# Patient Record
Sex: Male | Born: 1965 | Race: Black or African American | Hispanic: No | Marital: Single | Smoking: Never smoker
Health system: Southern US, Community
[De-identification: ages and names within clinical notes are randomized; demographics above are authoritative.]

## PROBLEM LIST (undated history)

## (undated) DIAGNOSIS — F1010 Alcohol abuse, uncomplicated: Secondary | ICD-10-CM

## (undated) DIAGNOSIS — R040 Epistaxis: Secondary | ICD-10-CM

---

## 2004-02-09 ENCOUNTER — Emergency Department: Payer: Self-pay | Admitting: Emergency Medicine

## 2004-02-10 ENCOUNTER — Emergency Department: Payer: Self-pay | Admitting: Emergency Medicine

## 2015-11-14 ENCOUNTER — Encounter: Payer: Self-pay | Admitting: Emergency Medicine

## 2015-11-14 ENCOUNTER — Emergency Department
Admission: EM | Admit: 2015-11-14 | Discharge: 2015-11-14 | Disposition: A | Discharge status: Home / Self Care | Payer: Self-pay | Attending: Emergency Medicine | Admitting: Emergency Medicine

## 2015-11-14 DIAGNOSIS — M799 Soft tissue disorder, unspecified: Secondary | ICD-10-CM | POA: Insufficient documentation

## 2015-11-14 DIAGNOSIS — M7989 Other specified soft tissue disorders: Secondary | ICD-10-CM

## 2015-11-14 DIAGNOSIS — K4090 Unilateral inguinal hernia, without obstruction or gangrene, not specified as recurrent: Secondary | ICD-10-CM | POA: Insufficient documentation

## 2015-11-14 DIAGNOSIS — K4091 Unilateral inguinal hernia, without obstruction or gangrene, recurrent: Secondary | ICD-10-CM

## 2015-11-14 HISTORY — DX: Epistaxis: R04.0

## 2015-11-14 LAB — COMPREHENSIVE METABOLIC PANEL
ALT: 51 U/L (ref 17–63)
ANION GAP: 6 (ref 5–15)
AST: 115 U/L — ABNORMAL HIGH (ref 15–41)
Albumin: 3.9 g/dL (ref 3.5–5.0)
Alkaline Phosphatase: 55 U/L (ref 38–126)
BUN: 9 mg/dL (ref 6–20)
CHLORIDE: 106 mmol/L (ref 101–111)
CO2: 27 mmol/L (ref 22–32)
CREATININE: 0.85 mg/dL (ref 0.61–1.24)
Calcium: 8.8 mg/dL — ABNORMAL LOW (ref 8.9–10.3)
GFR calc non Af Amer: >60 mL/min (ref >60)
Glucose, Bld: 98 mg/dL (ref 65–99)
POTASSIUM: 3.9 mmol/L (ref 3.5–5.1)
SODIUM: 139 mmol/L (ref 135–145)
Total Bilirubin: 0.5 mg/dL (ref 0.3–1.2)
Total Protein: 7.7 g/dL (ref 6.5–8.1)

## 2015-11-14 LAB — CBC
HEMATOCRIT: 39 % — AB (ref 40.0–52.0)
HEMOGLOBIN: 13.2 g/dL (ref 13.0–18.0)
MCH: 30 pg (ref 26.0–34.0)
MCHC: 33.9 g/dL (ref 32.0–36.0)
MCV: 88.4 fL (ref 80.0–100.0)
Platelets: 167 10*3/uL (ref 150–440)
RBC: 4.41 MIL/uL (ref 4.40–5.90)
RDW: 14.4 % (ref 11.5–14.5)
WBC: 3.7 10*3/uL — ABNORMAL LOW (ref 3.8–10.6)

## 2015-11-14 LAB — URINALYSIS COMPLETE WITH MICROSCOPIC (ARMC ONLY)
Bilirubin Urine: NEGATIVE
Glucose, UA: NEGATIVE mg/dL
HGB URINE DIPSTICK: NEGATIVE
Ketones, ur: NEGATIVE mg/dL
Leukocytes, UA: NEGATIVE
Nitrite: NEGATIVE
PH: 5 (ref 5.0–8.0)
PROTEIN: NEGATIVE mg/dL
Specific Gravity, Urine: 1.017 (ref 1.005–1.030)

## 2015-11-14 LAB — LIPASE, BLOOD: LIPASE: 25 U/L (ref 11–51)

## 2015-11-14 MED ORDER — TRAMADOL HCL 50 MG PO TABS: 50.0000 mg | ORAL_TABLET | Freq: Four times a day (QID) | ORAL | 0 refills | Status: AC | PRN

## 2015-11-14 NOTE — ED Notes (Signed)
Discharge instructions reviewed with patient. Questions fielded by this RN. Patient verbalizes understanding of instructions. Patient discharged home in stable condition per Williams MD . No acute distress noted at time of discharge.   

## 2015-11-14 NOTE — ED Triage Notes (Signed)
Inguinal hernia x 2 year and cyst to left buttock x 2 years.  Patient's niece stated that today patient was helping a family member move and started to c/o pain to inguinal hernia.

## 2015-11-14 NOTE — ED Provider Notes (Signed)
[facility] Emergency Department Provider Note        Time seen: ----------------------------------------- 9:12 PM on 11/14/2015 -----------------------------------------    I have reviewed the triage vital signs and the nursing notes.   HISTORY  Chief Complaint Abdominal Pain    HPI Nicholas Ali is a 50 y.o. male who presents to ER for right inguinal pain for the last 2 years and a cyst on his right buttock for the last 2 years. Patient's niece stated today he was helping a family member move and started complaining of pain related to the inguinal hernia. Patient has been evaluated in the past for this but did not have insurance so it could not be taken care of. Patient states the hernia does sometimes completely resolve. He denies fevers, vomiting or other complaints.   Past Medical History:  Diagnosis Date  . Epistaxis     There are no active problems to display for this patient.   History reviewed. No pertinent surgical history.  Allergies Review of patient's allergies indicates no known allergies.  Social History Social History  Substance Use Topics  . Smoking status: Never Smoker  . Smokeless tobacco: Former Neurosurgeon  . Alcohol use Yes    Review of Systems Constitutional: Negative for fever. Cardiovascular: Negative for chest pain. Respiratory: Negative for shortness of breath. Gastrointestinal: Positive for right inguinal pain Genitourinary: Negative for dysuria. Musculoskeletal: Negative for back pain. Skin: Positive for cystic lesion on the right buttock Neurological: Negative for headaches, focal weakness or numbness.  10-point ROS otherwise negative.  ____________________________________________   PHYSICAL EXAM:  VITAL SIGNS: ED Triage Vitals  Enc Vitals Group     BP 11/14/15 2058 125/84     Pulse Rate 11/14/15 2058 83     Resp 11/14/15 2058 18     Temp 11/14/15 2058 98.1 F (36.7 C)     Temp Source 11/14/15 2058  Oral     SpO2 11/14/15 2058 97 %     Weight 11/14/15 2058 189 lb (85.7 kg)     Height 11/14/15 2058 6' (1.829 m)     Head Circumference --      Peak Flow --      Pain Score 11/14/15 2059 6     Pain Loc --      Pain Edu? --      Excl. in GC? --     Constitutional: Alert and oriented. Well appearing and in no distress. Eyes: Conjunctivae are normal. PERRL. Normal extraocular movements. ENT   Head: Normocephalic and atraumatic.   Nose: No congestion/rhinnorhea.   Mouth/Throat: Mucous membranes are moist.   Neck: No stridor. Cardiovascular: Normal rate, regular rhythm. No murmurs, rubs, or gallops. Respiratory: Normal respiratory effort without tachypnea nor retractions. Breath sounds are clear and equal bilaterally. No wheezes/rales/rhonchi. Gastrointestinal: Large, nontender, not reducible right inguinal hernia. Musculoskeletal: Nontender with normal range of motion in all extremities. No lower extremity tenderness nor edema. Neurologic:  Normal speech and language. No gross focal neurologic deficits are appreciated.  Skin:  Large, nontender and irregular cystic structure over the outer aspect of the right buttock Psychiatric: Mood and affect are normal. Speech and behavior are normal.  ____________________________________________  ED COURSE:  Pertinent labs & imaging results that were available during my care of the patient were reviewed by me and considered in my medical decision making (see chart for details). Clinical Course  Patient is in no acute distress, these are chronic and do not require immediate attention. He does  not have evidence of strangulated bowel. He'll be referred to general surgery for outpatient follow-up.  Procedures ____________________________________________   LABS (pertinent positives/negatives)  Labs Reviewed  CBC - Abnormal; Notable for the following:       Result Value   WBC 3.7 (*)    HCT 39.0 (*)    All other components within  normal limits  LIPASE, BLOOD  COMPREHENSIVE METABOLIC PANEL  URINALYSIS COMPLETEWITH MICROSCOPIC ([facility] ONLY)  ____________________________________________  FINAL ASSESSMENT AND PLAN  Inguinal hernia, cyst  Plan: Patient with labs as dictated above. Again these issues are chronic and likely exacerbated by lifting the day but examination is benign. He is referred to general surgery as an outpatient.   Emily Filbert, MD   Note: This dictation was prepared with Dragon dictation. Any transcriptional errors that result from this process are unintentional    Emily Filbert, MD 11/14/15 2131

## 2015-11-20 ENCOUNTER — Ambulatory Visit: Payer: Self-pay | Admitting: Surgery

## 2015-11-30 ENCOUNTER — Ambulatory Visit: Payer: Self-pay | Admitting: Surgery

## 2017-07-03 ENCOUNTER — Emergency Department
Admission: EM | Admit: 2017-07-03 | Discharge: 2017-07-03 | Disposition: A | Discharge status: Home / Self Care | Payer: Self-pay | Attending: Emergency Medicine | Admitting: Emergency Medicine

## 2017-07-03 ENCOUNTER — Other Ambulatory Visit: Payer: Self-pay

## 2017-07-03 ENCOUNTER — Emergency Department: Payer: Self-pay

## 2017-07-03 DIAGNOSIS — F1010 Alcohol abuse, uncomplicated: Secondary | ICD-10-CM

## 2017-07-03 DIAGNOSIS — F22 Delusional disorders: Secondary | ICD-10-CM | POA: Insufficient documentation

## 2017-07-03 DIAGNOSIS — Z87828 Personal history of other (healed) physical injury and trauma: Secondary | ICD-10-CM

## 2017-07-03 DIAGNOSIS — R440 Auditory hallucinations: Secondary | ICD-10-CM

## 2017-07-03 DIAGNOSIS — R410 Disorientation, unspecified: Secondary | ICD-10-CM

## 2017-07-03 LAB — COMPREHENSIVE METABOLIC PANEL
ALK PHOS: 69 U/L (ref 38–126)
ALT: 17 U/L (ref 17–63)
AST: 29 U/L (ref 15–41)
Albumin: 4.3 g/dL (ref 3.5–5.0)
Anion gap: 11 (ref 5–15)
BUN: 10 mg/dL (ref 6–20)
CALCIUM: 9.1 mg/dL (ref 8.9–10.3)
CO2: 25 mmol/L (ref 22–32)
CREATININE: 0.93 mg/dL (ref 0.61–1.24)
Chloride: 98 mmol/L — ABNORMAL LOW (ref 101–111)
Glucose, Bld: 109 mg/dL — ABNORMAL HIGH (ref 65–99)
Potassium: 3.7 mmol/L (ref 3.5–5.1)
SODIUM: 134 mmol/L — AB (ref 135–145)
Total Bilirubin: 0.6 mg/dL (ref 0.3–1.2)
Total Protein: 8.6 g/dL — ABNORMAL HIGH (ref 6.5–8.1)

## 2017-07-03 LAB — URINALYSIS, COMPLETE (UACMP) WITH MICROSCOPIC
Bacteria, UA: NONE SEEN
Bilirubin Urine: NEGATIVE
GLUCOSE, UA: NEGATIVE mg/dL
HGB URINE DIPSTICK: NEGATIVE
Ketones, ur: NEGATIVE mg/dL
Leukocytes, UA: NEGATIVE
NITRITE: NEGATIVE
PH: 6 (ref 5.0–8.0)
Protein, ur: NEGATIVE mg/dL
Specific Gravity, Urine: 1.014 (ref 1.005–1.030)
Squamous Epithelial / LPF: NONE SEEN

## 2017-07-03 LAB — FOLATE: Folate: 17.4 ng/mL (ref >5.9)

## 2017-07-03 LAB — CBC
HCT: 45.8 % (ref 40.0–52.0)
HEMOGLOBIN: 15 g/dL (ref 13.0–18.0)
MCH: 26.6 pg (ref 26.0–34.0)
MCHC: 32.7 g/dL (ref 32.0–36.0)
MCV: 81.1 fL (ref 80.0–100.0)
PLATELETS: 173 10*3/uL (ref 150–440)
RBC: 5.64 MIL/uL (ref 4.40–5.90)
RDW: 18.7 % — ABNORMAL HIGH (ref 11.5–14.5)
WBC: 5.3 10*3/uL (ref 3.8–10.6)

## 2017-07-03 LAB — URINE DRUG SCREEN, QUALITATIVE (ARMC ONLY)
AMPHETAMINES, UR SCREEN: NOT DETECTED
Barbiturates, Ur Screen: NOT DETECTED
Benzodiazepine, Ur Scrn: NOT DETECTED
CANNABINOID 50 NG, UR ~~LOC~~: NOT DETECTED
COCAINE METABOLITE, UR ~~LOC~~: NOT DETECTED
MDMA (ECSTASY) UR SCREEN: NOT DETECTED
Methadone Scn, Ur: NOT DETECTED
OPIATE, UR SCREEN: NOT DETECTED
PHENCYCLIDINE (PCP) UR S: NOT DETECTED
Tricyclic, Ur Screen: NOT DETECTED

## 2017-07-03 LAB — TSH: TSH: 5.59 u[IU]/mL — AB (ref 0.350–4.500)

## 2017-07-03 LAB — T4, FREE: Free T4: 0.99 ng/dL (ref 0.61–1.12)

## 2017-07-03 LAB — RAPID HIV SCREEN (HIV 1/2 AB+AG)
HIV 1/2 ANTIBODIES: NONREACTIVE
HIV-1 P24 ANTIGEN - HIV24: NONREACTIVE

## 2017-07-03 LAB — VITAMIN B12: VITAMIN B 12: 255 pg/mL (ref 180–914)

## 2017-07-03 LAB — ETHANOL

## 2017-07-03 IMAGING — CT CT HEAD W/O CM
3 series · 16 of 47 positions shown, 19 images · non-contrast
Comparison: None available

CLINICAL DATA: Hallucination, altered mental status

EXAM:
CT HEAD WITHOUT CONTRAST
TECHNIQUE: Contiguous axial images were obtained from the base of the skull
through the vertex without intravenous contrast.

[Series 2: head wo · axial · 0.40mm/px · z∈[-114,+16]mm · 10 of 32 slices shown, 13 images]
[im 3/32  brain]
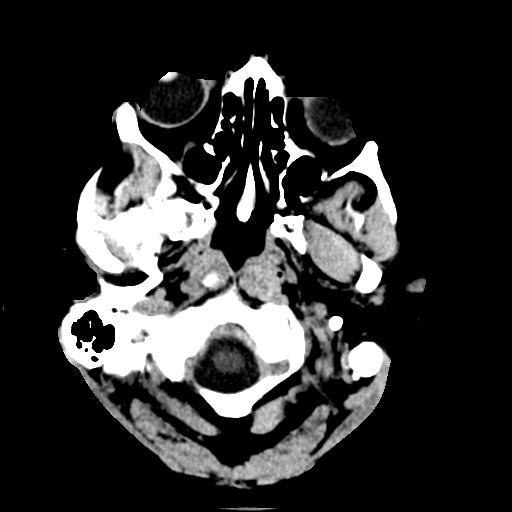
[im 3/32  bone]
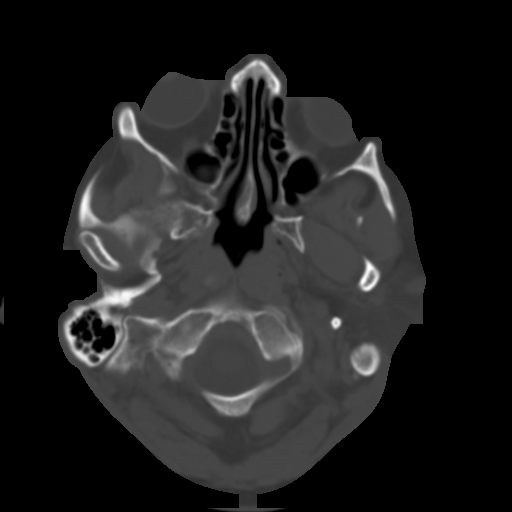
[im 6/32  brain]
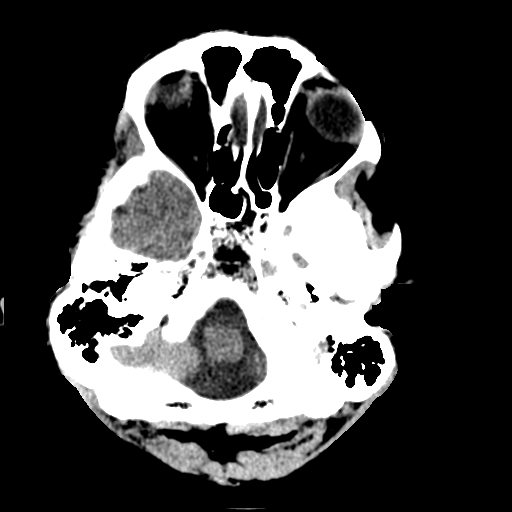
[im 9/32  brain]
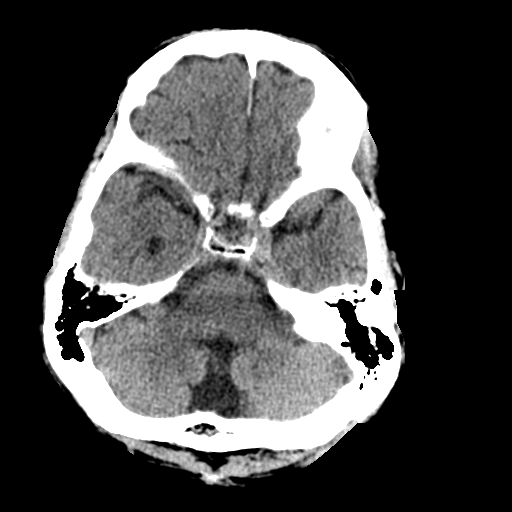
[im 11/32  brain]
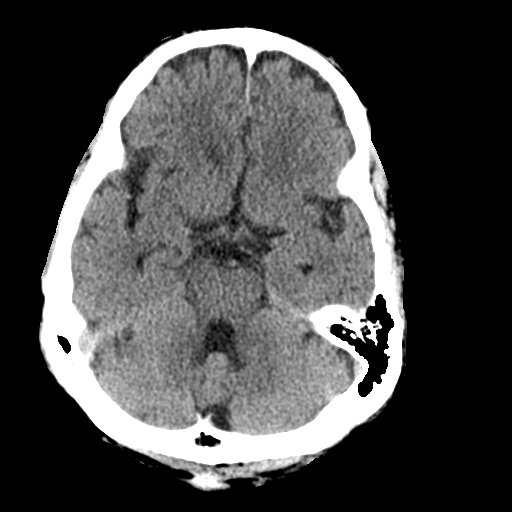
[im 14/32  brain]
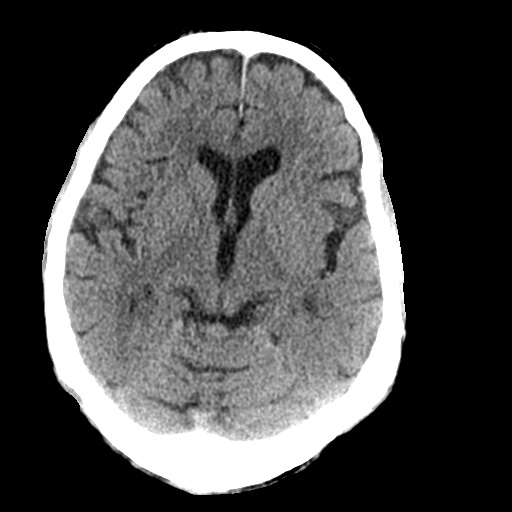
[im 14/32  bone]
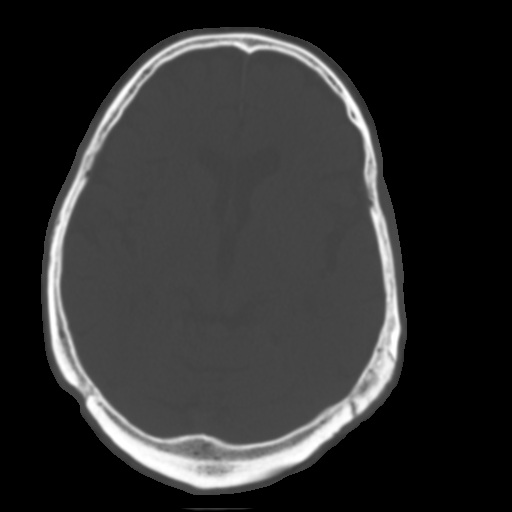
[im 18/32  brain]
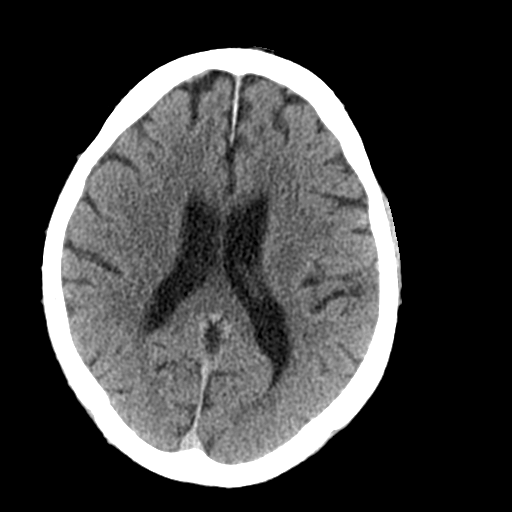
[im 21/32  brain]
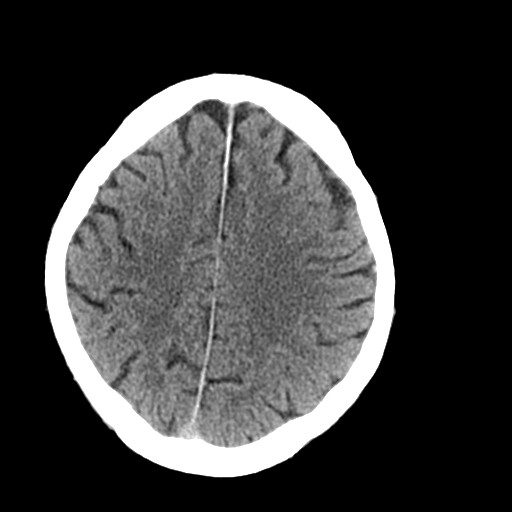
[im 24/32  brain]
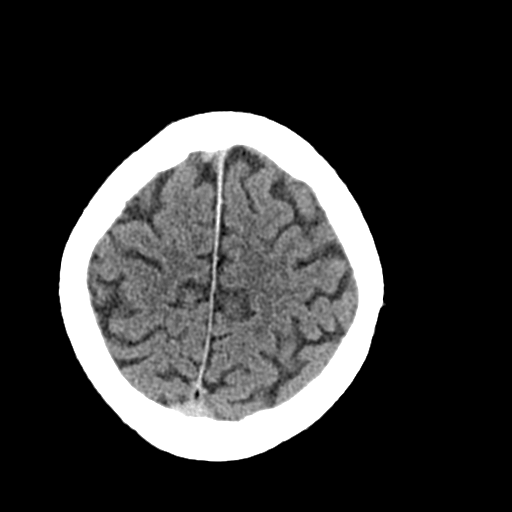
[im 26/32  brain]
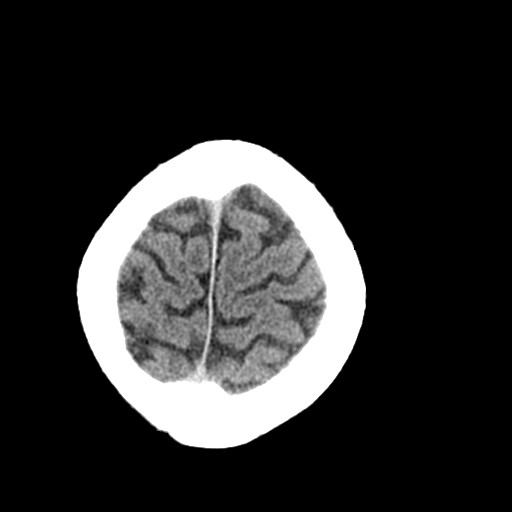
[im 26/32  bone]
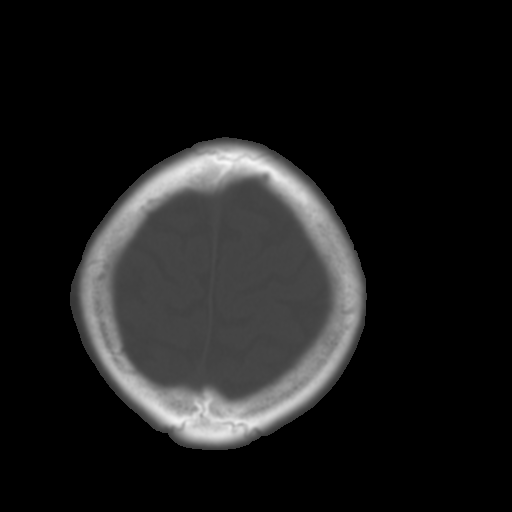
[im 29/32  brain]
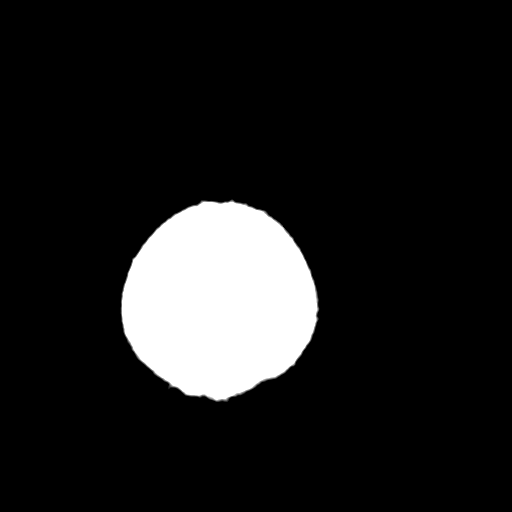

[Series 4: coronal soft tissue · coronal · 0.32mm/px · 3 of 65 slices shown]
[im 22/65  brain]
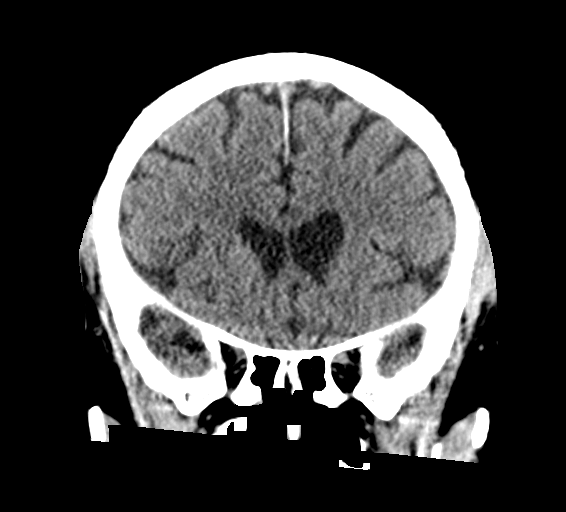
[im 29/65  brain]
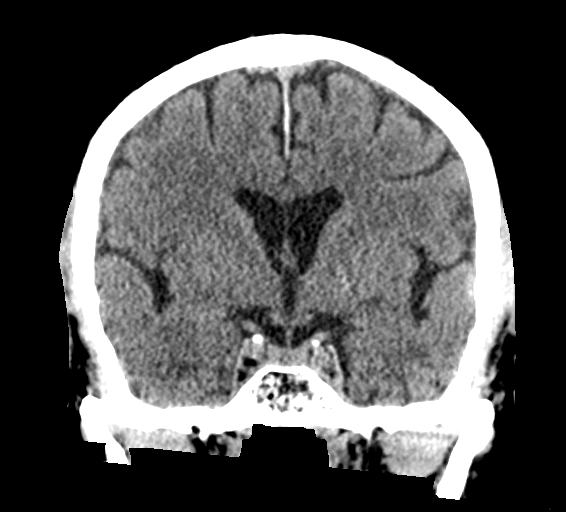
[im 36/65  brain]
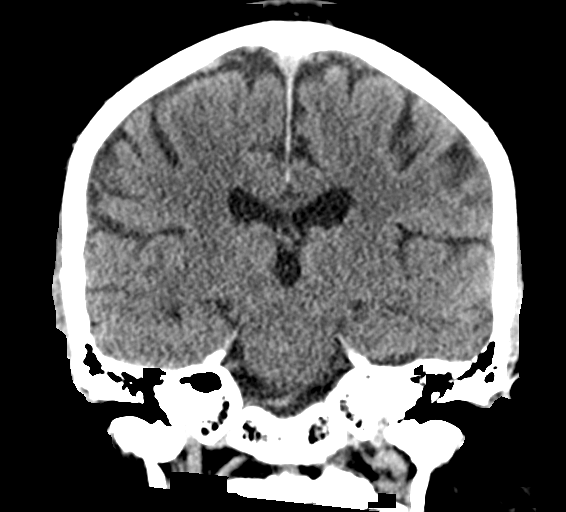

[Series 5: sagittal soft tissue · sagittal · 0.36mm/px · 3 of 52 slices shown]
[im 18/52  brain]
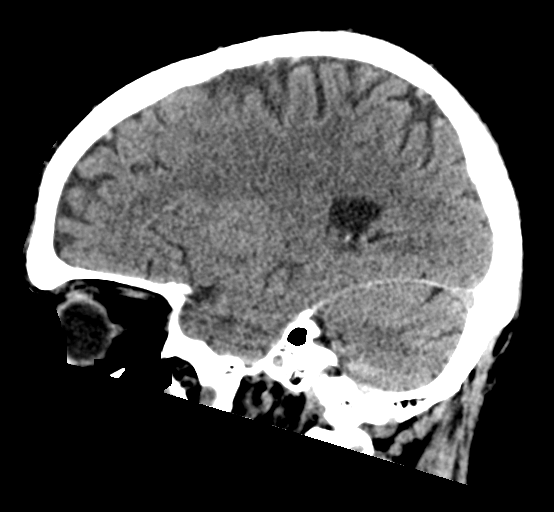
[im 26/52  brain]
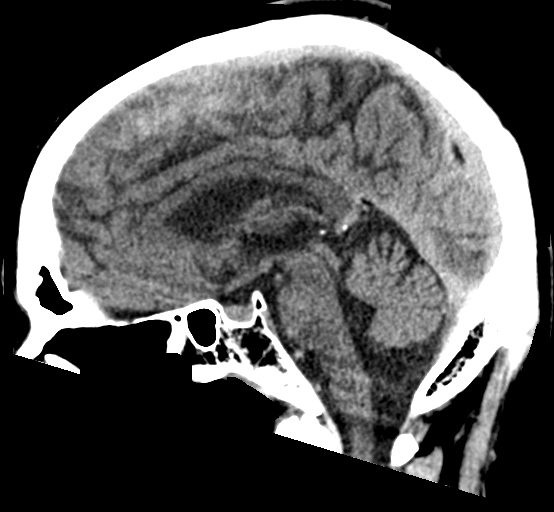
[im 35/52  brain]
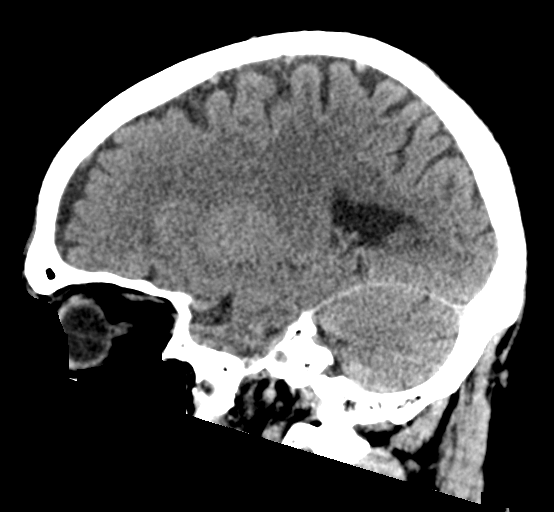

[16 of 47 positions shown; findings below may reference images not displayed]

FINDINGS: Brain: Mild atrophy pattern without acute intracranial hemorrhage,
mass lesion, midline shift, herniation or hydrocephalus. No
extra-axial fluid collection. No focal mass effect or edema.
Cisterns are patent. No cerebellar abnormality.

Vascular: No hyperdense vessel or unexpected calcification.

Skull: Normal. Negative for fracture or focal lesion.

Sinuses/Orbits: Orbits unremarkable. Right maxillary retention cyst
or polyp partially imaged measures 15 mm.

Other: None.
IMPRESSION: Mild brain atrophy without acute intracranial abnormality by
noncontrast CT.
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
## 2017-07-03 NOTE — ED Notes (Signed)
Butch RN to dress out patient.
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
## 2017-07-03 NOTE — ED Notes (Signed)
SOC complete.  

## 2017-07-03 NOTE — ED Notes (Signed)
NAD noted at time of D/C. Pt denies questions or concerns. Pt taken to the lobby via wheelchair at this time. Pt D/C discussed with Gearldine BienenstockBrandy, Consulting civil engineerCharge RN, due to patient being alert and oriented, and considered safe for D/C pt to be placed in front of [employer] officer and courtesy phone to continue to try to find a ride.

## 2017-07-03 NOTE — ED Notes (Signed)
Pt given meal tray at this time. NAD noted. Will continue to monitor for further patient needs. 

## 2017-07-03 NOTE — ED Notes (Signed)
IVC PAPERS INITIATED BY MD FORBACH/RN MEGAN AND ODS SECURITY MADE AWARE.
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
## 2017-07-03 NOTE — ED Notes (Signed)
Patient roomed in room 21. Patient denies SI/HI. Patient states he hears voices and sometimes see hallucinations. Patient sitting in room calm and cooperative.

## 2017-07-03 NOTE — ED Notes (Signed)
Pt given breakfast tray at this time. Pt sitting up on side of bed eating. Will continue to monitor for further patient needs.

## 2017-07-03 NOTE — ED Notes (Signed)
[facility] camera set up for assessment.
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
## 2017-07-05 LAB — RPR: RPR Ser Ql: NONREACTIVE

## 2018-03-02 ENCOUNTER — Encounter: Payer: Self-pay | Admitting: Intensive Care

## 2018-03-02 ENCOUNTER — Other Ambulatory Visit: Payer: Self-pay

## 2018-03-02 ENCOUNTER — Emergency Department
Admission: EM | Admit: 2018-03-02 | Discharge: 2018-03-02 | Disposition: A | Discharge status: Home / Self Care | Payer: Medicaid Other | Attending: Emergency Medicine | Admitting: Emergency Medicine

## 2018-03-02 DIAGNOSIS — Z0000 Encounter for general adult medical examination without abnormal findings: Secondary | ICD-10-CM | POA: Insufficient documentation

## 2018-03-02 DIAGNOSIS — F1011 Alcohol abuse, in remission: Secondary | ICD-10-CM

## 2018-03-02 HISTORY — DX: Alcohol abuse, uncomplicated: F10.10

## 2018-03-02 NOTE — ED Notes (Addendum)
FIRST NURSE NOTE:  Pt here with niece with reports of back pain.  Niece asking for social worker as well.  Pt alert and oriented, no distress noted on arrival.

## 2018-03-02 NOTE — ED Notes (Signed)
Pt verbalizes  "I have been clean and sober for about seven days - if I go to the other place I think I will be better and get to live clean."   Pt with niece at bedside
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
## 2018-03-02 NOTE — Discharge Instructions (Addendum)
Your exam and evaluation are overall reassuring in the emergency department today.  I see no active medical or musculoskeletal problems that should stop you from being able to enter your long-term alcohol treatment program/facility.  Return to the emergency department immediately for any worsening condition including confusion altered mental status, back pain, alcohol abuse or any depression or thoughts of wanting hurt yourself or others.

## 2018-03-02 NOTE — ED Notes (Signed)

## 2018-03-02 NOTE — ED Triage Notes (Signed)
Patients niece states he has [facility] and is here to get Medical clearance to be accepted into another facility for HX alcohol abuse. Denies pain.

## 2018-12-26 ENCOUNTER — Encounter: Payer: Self-pay | Admitting: Emergency Medicine

## 2018-12-26 ENCOUNTER — Emergency Department
Admission: EM | Admit: 2018-12-26 | Discharge: 2018-12-26 | Disposition: A | Discharge status: Home / Self Care | Payer: Medicaid Other | Attending: Student | Admitting: Student

## 2018-12-26 ENCOUNTER — Emergency Department: Payer: Medicaid Other

## 2018-12-26 DIAGNOSIS — R600 Localized edema: Secondary | ICD-10-CM

## 2018-12-26 LAB — CBC WITH DIFFERENTIAL/PLATELET
Abs Immature Granulocytes: 0.03 10*3/uL (ref 0.00–0.07)
Basophils Absolute: 0 10*3/uL (ref 0.0–0.1)
Basophils Relative: 1 %
Eosinophils Absolute: 0 10*3/uL (ref 0.0–0.5)
Eosinophils Relative: 1 %
HCT: 39.7 % (ref 39.0–52.0)
Hemoglobin: 12.9 g/dL — ABNORMAL LOW (ref 13.0–17.0)
Immature Granulocytes: 1 %
Lymphocytes Relative: 27 %
Lymphs Abs: 1.7 10*3/uL (ref 0.7–4.0)
MCH: 27.1 pg (ref 26.0–34.0)
MCHC: 32.5 g/dL (ref 30.0–36.0)
MCV: 83.4 fL (ref 80.0–100.0)
Monocytes Absolute: 0.5 10*3/uL (ref 0.1–1.0)
Monocytes Relative: 8 %
Neutro Abs: 4 10*3/uL (ref 1.7–7.7)
Neutrophils Relative %: 62 %
Platelets: 162 10*3/uL (ref 150–400)
RBC: 4.76 MIL/uL (ref 4.22–5.81)
RDW: 16.8 % — ABNORMAL HIGH (ref 11.5–15.5)
WBC: 6.3 10*3/uL (ref 4.0–10.5)
nRBC: 0 % (ref 0.0–0.2)

## 2018-12-26 LAB — BASIC METABOLIC PANEL
Anion gap: 11 (ref 5–15)
BUN: 9 mg/dL (ref 6–20)
CO2: 26 mmol/L (ref 22–32)
Calcium: 8.5 mg/dL — ABNORMAL LOW (ref 8.9–10.3)
Chloride: 103 mmol/L (ref 98–111)
Creatinine, Ser: 0.87 mg/dL (ref 0.61–1.24)
GFR calc Af Amer: >60 mL/min (ref >60)
GFR calc non Af Amer: >60 mL/min (ref >60)
Glucose, Bld: 106 mg/dL — ABNORMAL HIGH (ref 70–99)
Potassium: 3.2 mmol/L — ABNORMAL LOW (ref 3.5–5.1)
Sodium: 140 mmol/L (ref 135–145)

## 2018-12-26 LAB — BRAIN NATRIURETIC PEPTIDE: B Natriuretic Peptide: 110 pg/mL — ABNORMAL HIGH (ref 0.0–100.0)

## 2018-12-26 IMAGING — DX DG CHEST 1V
1 series · 1 of 1 positions shown · non-contrast
Comparison: None.

CLINICAL DATA: Leg swelling

EXAM:
CHEST  1 VIEW

[chest ap]
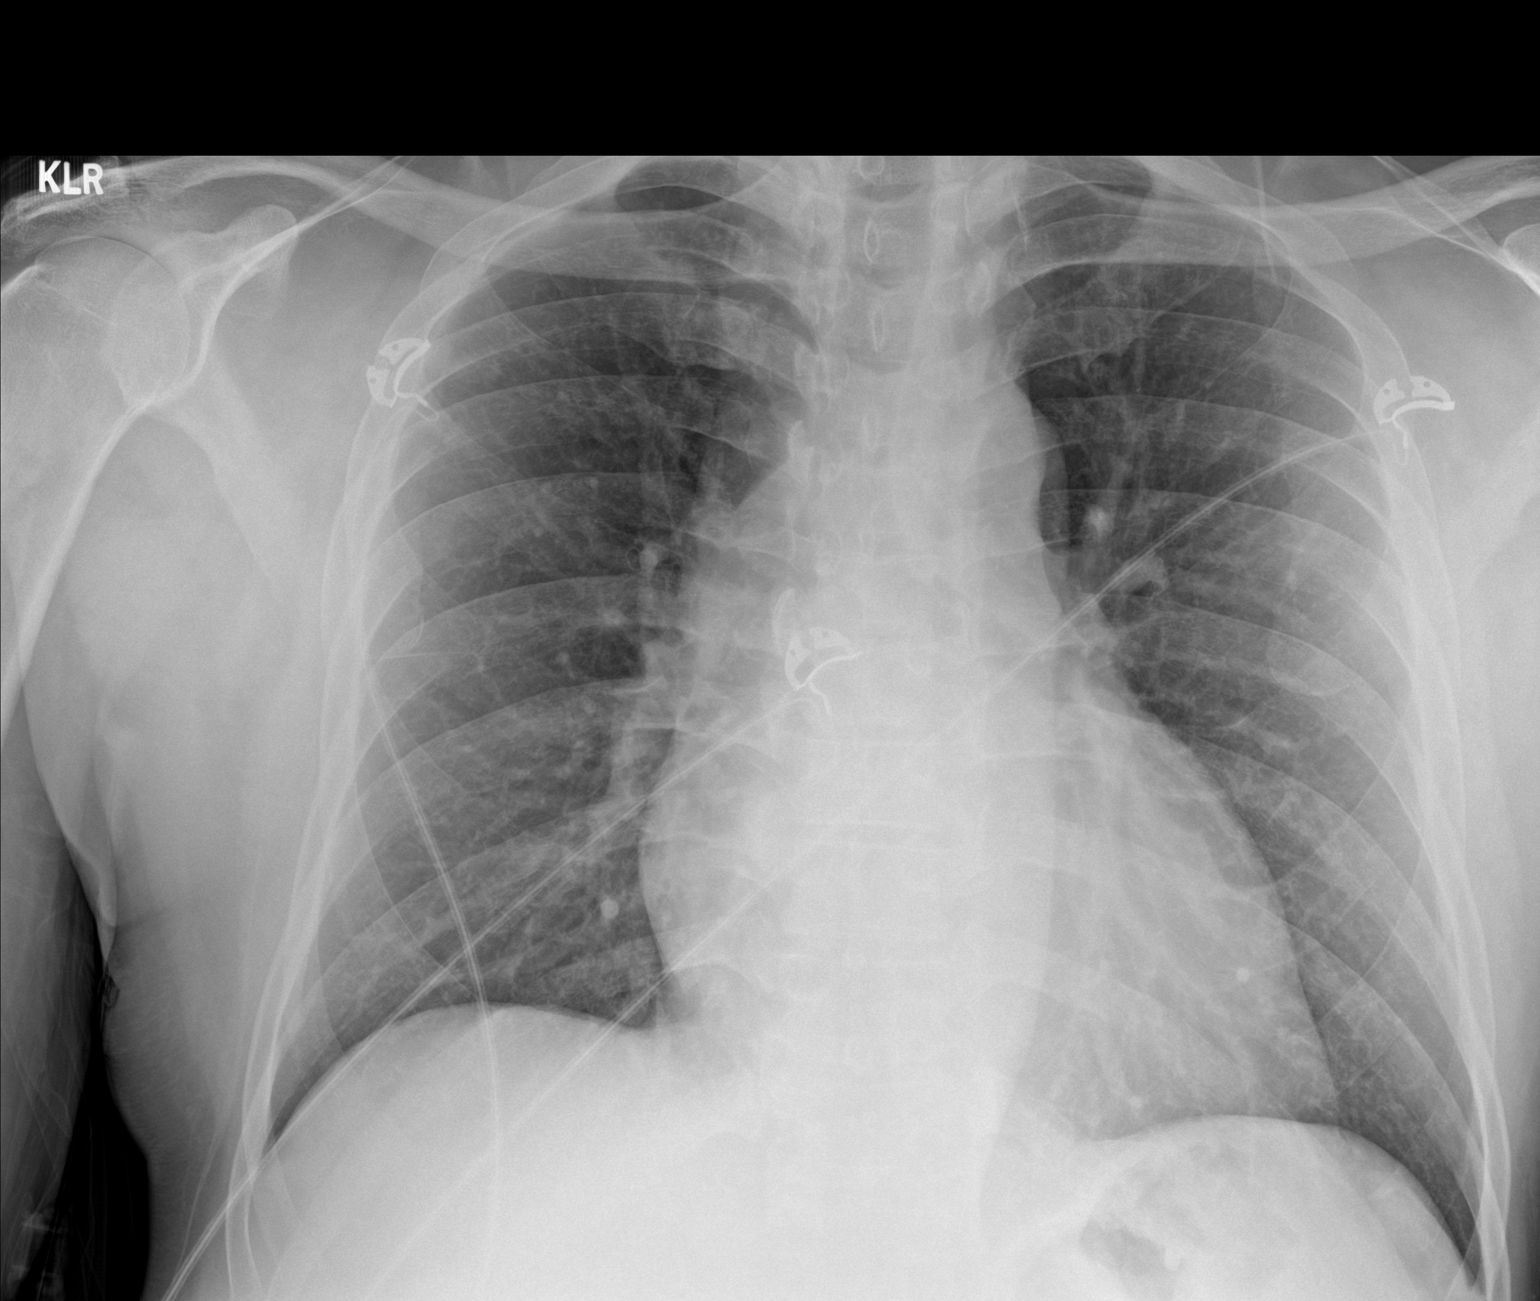

[1 of 1 positions shown; findings below may reference images not displayed]

FINDINGS: No focal opacity or pleural effusion. Borderline cardiomegaly
without edema. No pneumothorax
IMPRESSION: No active disease.  Borderline to mild cardiomegaly without edema

## 2018-12-26 MED ORDER — FUROSEMIDE 20 MG PO TABS: 20.0000 mg | ORAL_TABLET | Freq: Every day | ORAL | 0 refills | Status: DC

## 2018-12-26 MED ORDER — POTASSIUM CHLORIDE ER 10 MEQ PO TBCR: 20.0000 meq | EXTENDED_RELEASE_TABLET | Freq: Every day | ORAL | 0 refills | Status: DC

## 2018-12-26 MED ORDER — POTASSIUM CHLORIDE CRYS ER 20 MEQ PO TBCR
40.0000 meq | EXTENDED_RELEASE_TABLET | Freq: Once | ORAL | Status: AC
  Administered 2018-12-26: 40 meq via ORAL
  Filled 2018-12-26: qty 2

## 2018-12-26 NOTE — ED Provider Notes (Signed)
[facility] Emergency Department Provider Note  ____________________________________________   First MD Initiated Contact with Patient 12/26/18 1539     (approximate)  I have reviewed the triage vital signs and the nursing notes.  History  Chief Complaint Leg Swelling    HPI Nicholas Ali is a 53 y.o. male with hx as below who presents for bilateral leg swelling. Patient states he first noticed the swelling last night. He states normally he does not have an swelling. Swelling is symmetric and bilateral. He denies any weakness, numbness or tingling. He denies any trauma. He is able to walk w/o issue. He denies any fevers, chest pain, SOB, or difficulty breathing. No hx of heart failure. No hx of DVT or PE. No recent travel. No new medications. 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          Past Medical Hx Past Medical History:  Diagnosis Date  . Alcohol abuse   . Epistaxis     Problem List Patient Active Problem List   Diagnosis Date Noted  . Acute delirium 07/03/2017  . Alcohol abuse 07/03/2017  . History of head injury 07/03/2017    Past Surgical Hx History reviewed. No pertinent surgical history.  Medications Prior to Admission medications   Not on File    Allergies Patient has no known allergies.  Family Hx No family history on file.  Social Hx Social History   Tobacco Use  . Smoking status: Never Smoker  . Smokeless tobacco: Former Network engineer Use Topics  . Alcohol use: Not Currently  . Drug use: No     Review of Systems  Constitutional: Negative for fever. Negative for chills. Eyes: Negative for visual changes. ENT: Negative for sore throat. Cardiovascular: Negative for chest pain. Respiratory: Negative for shortness of breath. Gastrointestinal: Negative for abdominal pain. Negative for nausea. Negative for vomiting. Genitourinary: Negative for dysuria. Musculoskeletal:  + for leg swelling. Skin: Negative for rash. Neurological: Negative for for headaches.   Physical Exam  Vital Signs: ED Triage Vitals  Enc Vitals Group     BP 12/26/18 1522 (!) 151/92     Pulse Rate 12/26/18 1522 83     Resp 12/26/18 1547 19     Temp 12/26/18 1522 98.5 F (36.9 C)     Temp Source 12/26/18 1522 Oral     SpO2 12/26/18 1522 95 %     Weight 12/26/18 1532 200 lb (90.7 kg)     Height 12/26/18 1532 6\' 1"  (1.854 m)     Head Circumference --      Peak Flow --      Pain Score 12/26/18 1532 0     Pain Loc --      Pain Edu? --      Excl. in [place]? --     Constitutional: Alert and oriented.  Eyes: Conjunctivae clear. Sclera anicteric. Head: Normocephalic. Atraumatic. Nose: No congestion. No rhinorrhea. Mouth/Throat: Mucous membranes are moist.  Neck: No stridor.   Cardiovascular: Normal rate, regular rhythm. No murmurs. Extremities well perfused. Respiratory: Normal respiratory effort.  Lungs CTAB. Gastrointestinal: Soft and non-tender. No distention.  Musculoskeletal: Bilateral, symmetric pitting LE edema to mid shin. Neurologic:  Normal speech and language. No gross focal neurologic deficits are appreciated.  Skin: Skin is warm, dry and intact. No erythema, warmth, or induration.Marland Kitchen Psychiatric: Mood and affect are appropriate for situation.  EKG  N/A    Radiology  CXR: IMPRESSION:  No active disease. Borderline to mild cardiomegaly without edema     Procedures  Procedure(s) performed (including critical care):  Procedures   Initial Impression / Assessment and Plan / ED Course  53 y.o. male who presents to the ED who presents for bilateral lower extremity edema, as above  Ddx: heart failure (though no respiratory symptoms), dependent edema (on his feet all day). No evidence of infection or cellulitis. No asymmetry or risk factors for DVT.  Plan: will obtain basic labs, imaging.   Labs largely unremarkable, slightly low K. Will give PO repletion  here, and plan for low dose of Lasix to assist with fluid status. Will also give  PO K repletion to avoid hypokalemia with the Lasix. Advised PCP follow up and given return precautions. Patient agreeable w/ plan.     Final Clinical Impression(s) / ED Diagnosis  Final diagnoses:  Leg edema       Note:  This document was prepared using Dragon voice recognition software and may include unintentional dictation errors.   Miguel AschoffMonks, Devontae Casasola L., MD 12/26/18 2303

## 2018-12-26 NOTE — Discharge Instructions (Signed)
Thank you for letting us take care of you in the emergency department today.   New medications we have prescribed:  - lasix - this medication will help you pee off extra fluid - potassium - this will help keep your potassium levels normal  Please follow up with: - A primary care doctor to review your ER visit and follow up on your symptoms.   Please return to the ER for any new or worsening symptoms.

## 2018-12-26 NOTE — ED Triage Notes (Signed)
Pt to ED with c/o of bilateral leg swelling that started last night.

## 2020-10-14 ENCOUNTER — Other Ambulatory Visit: Payer: Self-pay

## 2020-10-14 DIAGNOSIS — Z87891 Personal history of nicotine dependence: Secondary | ICD-10-CM | POA: Insufficient documentation

## 2020-10-14 DIAGNOSIS — R519 Headache, unspecified: Secondary | ICD-10-CM | POA: Insufficient documentation

## 2020-10-14 DIAGNOSIS — R945 Abnormal results of liver function studies: Secondary | ICD-10-CM | POA: Insufficient documentation

## 2020-10-14 DIAGNOSIS — R110 Nausea: Secondary | ICD-10-CM | POA: Insufficient documentation

## 2020-10-14 LAB — CBC
HCT: 41.6 % (ref 39.0–52.0)
Hemoglobin: 14.2 g/dL (ref 13.0–17.0)
MCH: 30 pg (ref 26.0–34.0)
MCHC: 34.1 g/dL (ref 30.0–36.0)
MCV: 87.9 fL (ref 80.0–100.0)
Platelets: 150 10*3/uL (ref 150–400)
RBC: 4.73 MIL/uL (ref 4.22–5.81)
RDW: 15.7 % — ABNORMAL HIGH (ref 11.5–15.5)
WBC: 4.4 10*3/uL (ref 4.0–10.5)
nRBC: 0 % (ref 0.0–0.2)

## 2020-10-14 NOTE — ED Triage Notes (Signed)
Pt states headache that began tonight with nausea. Pt with reddened eyes. Pt denies photophobia, or sensitivity to sound. Pt appears in no acute distress. Pt also complains of watery stools.

## 2020-10-15 ENCOUNTER — Emergency Department
Admission: EM | Admit: 2020-10-15 | Discharge: 2020-10-15 | Disposition: A | Discharge status: Home / Self Care | Payer: Self-pay | Attending: Emergency Medicine | Admitting: Emergency Medicine

## 2020-10-15 ENCOUNTER — Emergency Department: Payer: Self-pay

## 2020-10-15 DIAGNOSIS — R519 Headache, unspecified: Secondary | ICD-10-CM

## 2020-10-15 DIAGNOSIS — R7989 Other specified abnormal findings of blood chemistry: Secondary | ICD-10-CM

## 2020-10-15 LAB — COMPREHENSIVE METABOLIC PANEL
ALT: 71 U/L — ABNORMAL HIGH (ref 0–44)
AST: 169 U/L — ABNORMAL HIGH (ref 15–41)
Albumin: 4 g/dL (ref 3.5–5.0)
Alkaline Phosphatase: 65 U/L (ref 38–126)
Anion gap: 10 (ref 5–15)
BUN: 7 mg/dL (ref 6–20)
CO2: 23 mmol/L (ref 22–32)
Calcium: 8.8 mg/dL — ABNORMAL LOW (ref 8.9–10.3)
Chloride: 101 mmol/L (ref 98–111)
Creatinine, Ser: 0.95 mg/dL (ref 0.61–1.24)
GFR, Estimated: >60 mL/min (ref >60)
Glucose, Bld: 83 mg/dL (ref 70–99)
Potassium: 4 mmol/L (ref 3.5–5.1)
Sodium: 134 mmol/L — ABNORMAL LOW (ref 135–145)
Total Bilirubin: 0.7 mg/dL (ref 0.3–1.2)
Total Protein: 8.1 g/dL (ref 6.5–8.1)

## 2020-10-15 IMAGING — CT CT HEAD W/O CM
3 series · 16 of 47 positions shown, 19 images · non-contrast
Comparison: Head CT [DATE].

CLINICAL DATA: 55-year-old male with acute headache and nausea
tonight.

EXAM:
CT HEAD WITHOUT CONTRAST
TECHNIQUE: Contiguous axial images were obtained from the base of the skull
through the vertex without intravenous contrast.

[Series 2: head wo · axial · 0.39mm/px · z∈[-90,+45]mm · 10 of 33 slices shown, 13 images]
[im 3/33  brain]
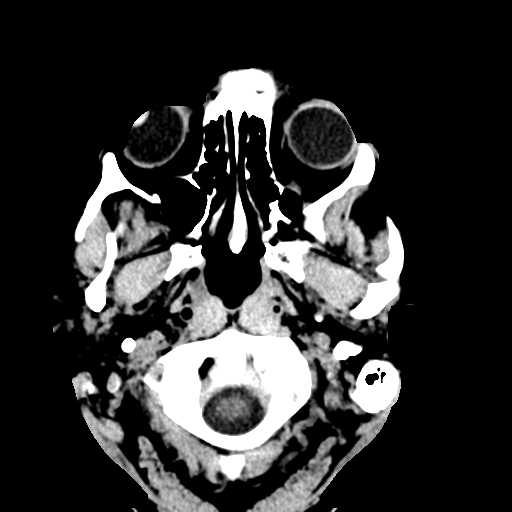
[im 3/33  bone]
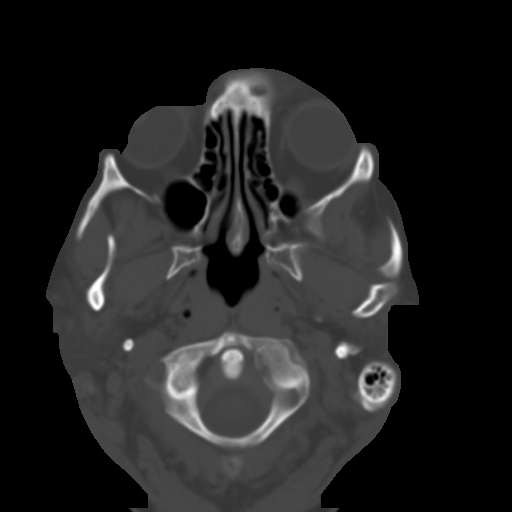
[im 6/33  brain]
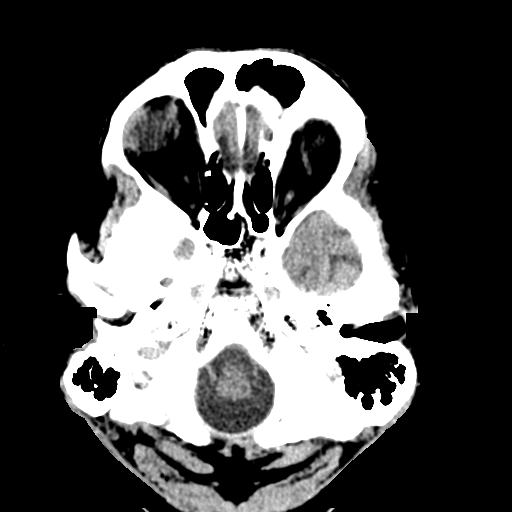
[im 9/33  brain]
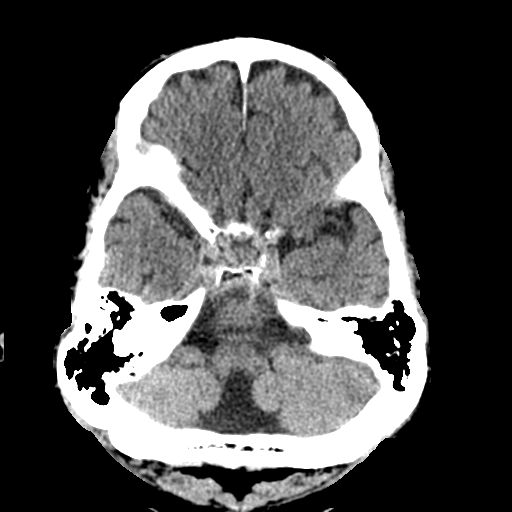
[im 12/33  brain]
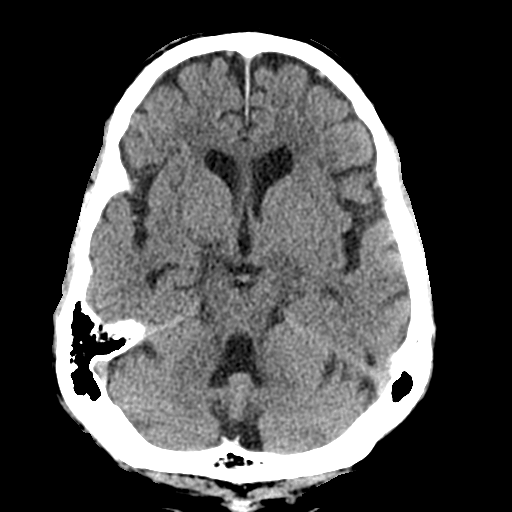
[im 15/33  brain]
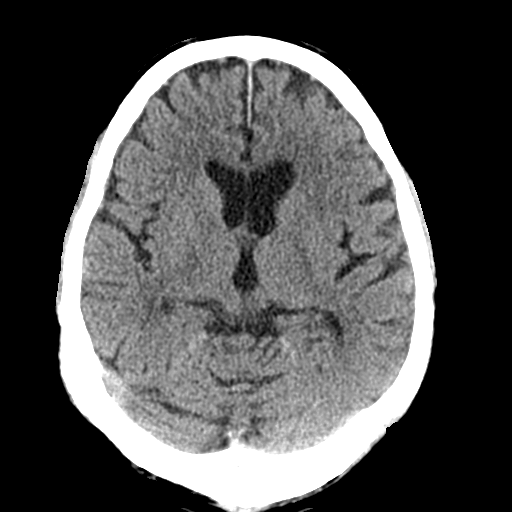
[im 15/33  bone]
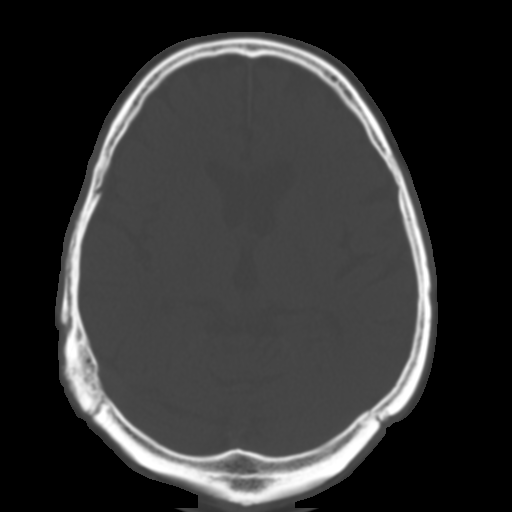
[im 18/33  brain]
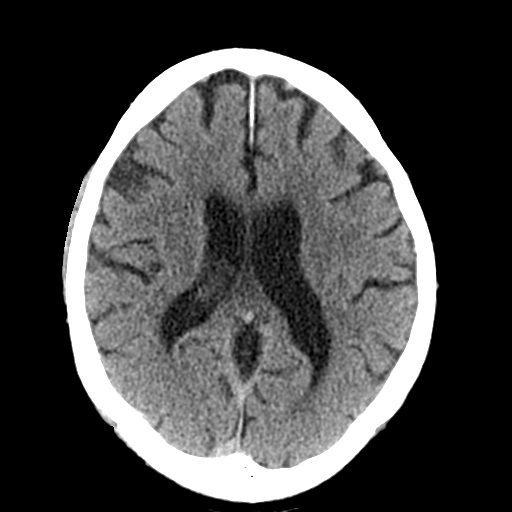
[im 21/33  brain]
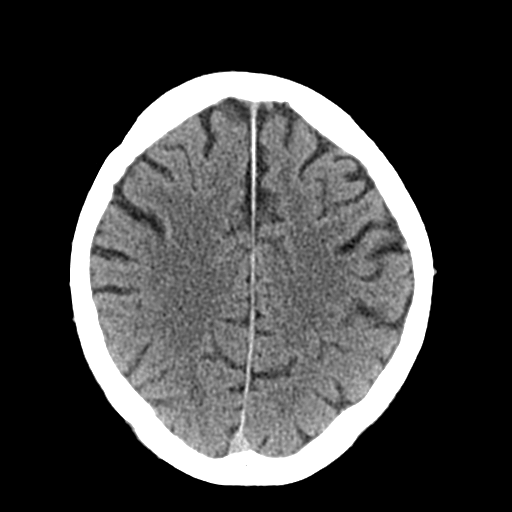
[im 25/33  brain]
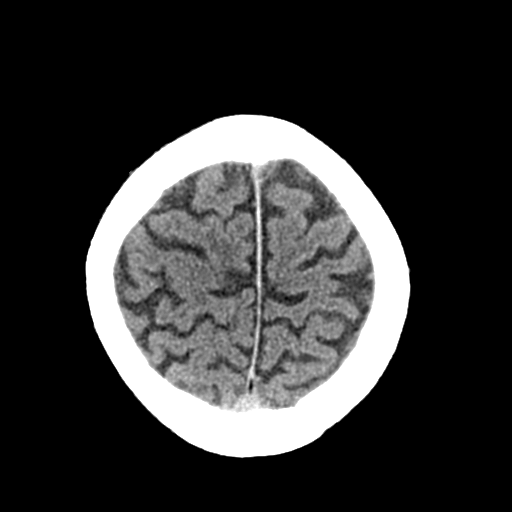
[im 27/33  brain]
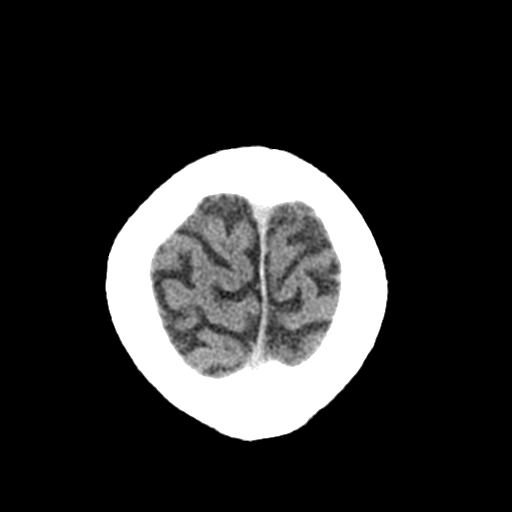
[im 27/33  bone]
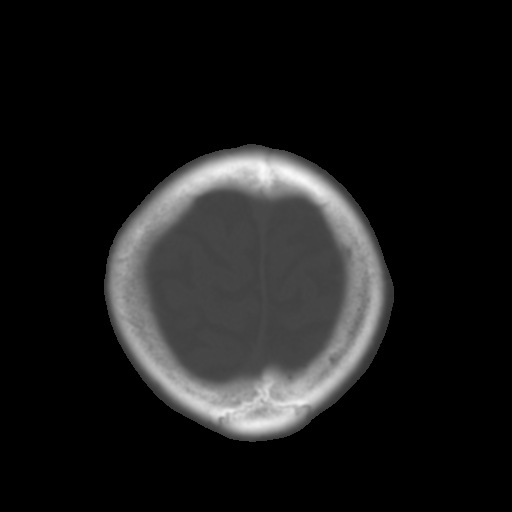
[im 30/33  brain]
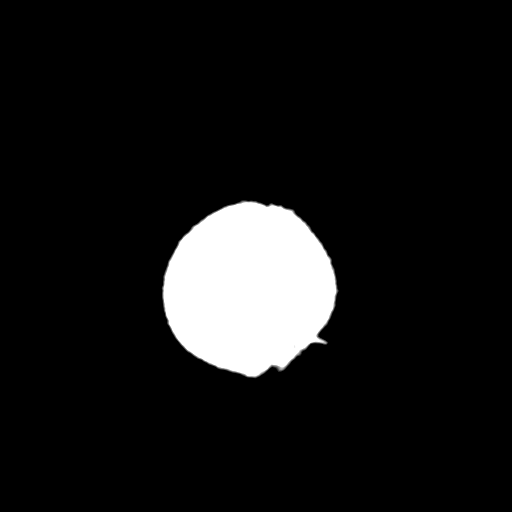

[Series 4: coronal soft tissue · coronal · 0.40mm/px · 3 of 72 slices shown]
[im 24/72  brain]
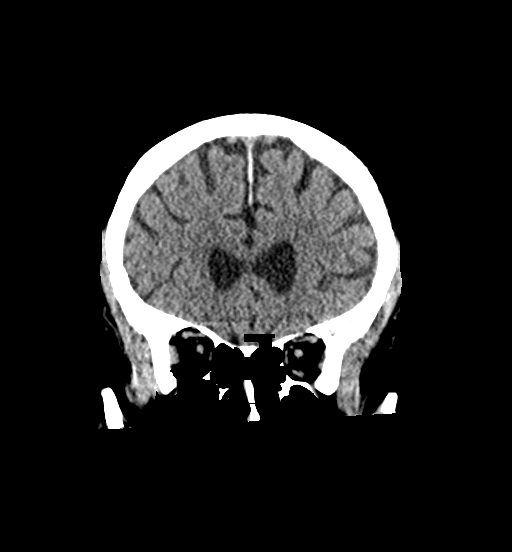
[im 32/72  brain]
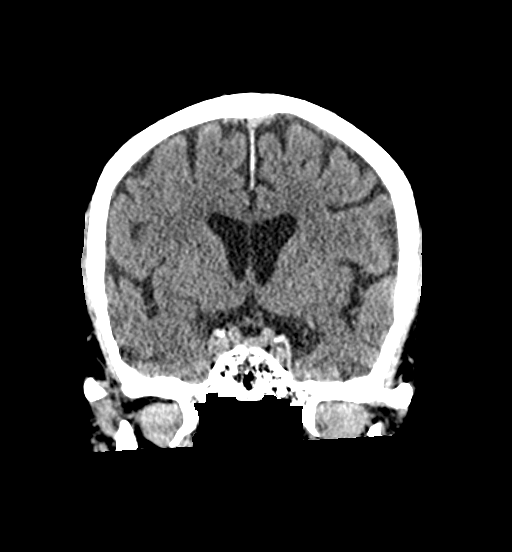
[im 40/72  brain]
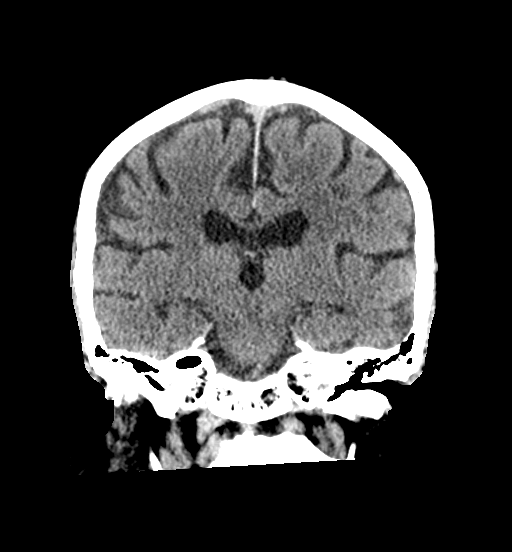

[Series 5: sagittal soft tissue · sagittal · 0.35mm/px · 3 of 59 slices shown]
[im 20/59  brain]
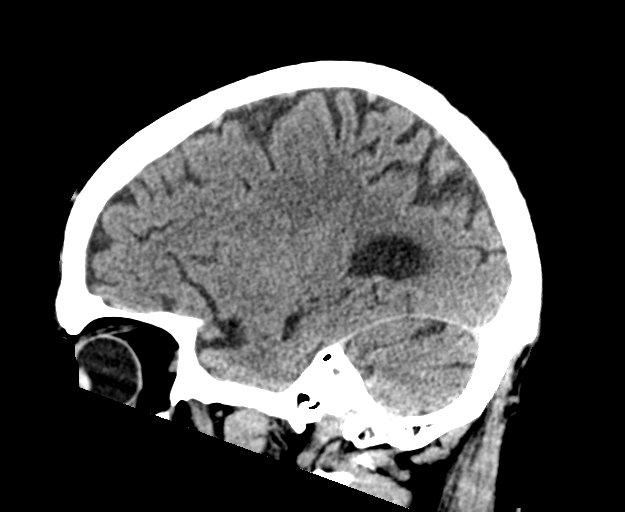
[im 30/59  brain]
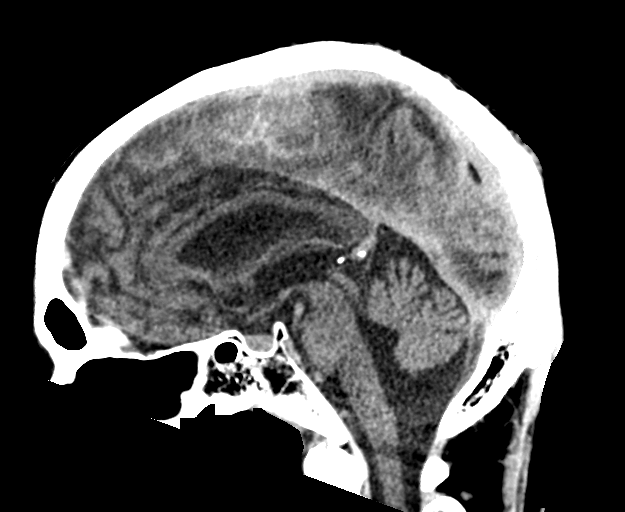
[im 39/59  brain]
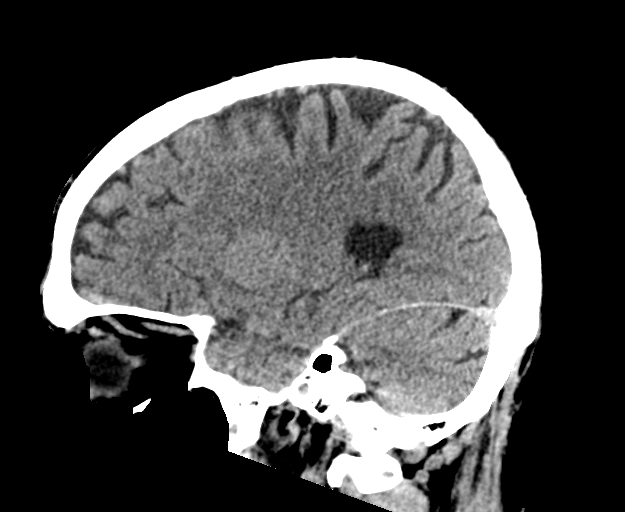

[16 of 47 positions shown; findings below may reference images not displayed]

FINDINGS: Brain: Stable cerebral volume since [77]. Mega cisterna magna normal
variant. No midline shift, ventriculomegaly, mass effect, evidence
of mass lesion, intracranial hemorrhage or evidence of cortically
based acute infarction. Gray-white matter differentiation is within
normal limits throughout the brain. No encephalomalacia identified.

Vascular: No suspicious intracranial vascular hyperdensity.

Skull: Stable and negative.

Sinuses/Orbits: Visualized paranasal sinuses and mastoids are stable
and well aerated.

Other: Disconjugate gaze similar to the [77] CT. Otherwise negative
orbit and scalp soft tissues.
IMPRESSION: Negative noncontrast CT appearance of the brain, stable since [DATE].

## 2020-10-15 MED ORDER — ACETAMINOPHEN 500 MG PO TABS
1000.0000 mg | ORAL_TABLET | Freq: Once | ORAL | Status: AC
  Administered 2020-10-15: 1000 mg via ORAL
  Filled 2020-10-15: qty 2

## 2020-10-15 NOTE — ED Notes (Signed)
MD at the bedside  

## 2020-10-15 NOTE — ED Provider Notes (Signed)
[facility] Emergency Department Provider Note  ____________________________________________   Event Date/Time   First MD Initiated Contact with Patient 10/15/20 0335     (approximate)  I have reviewed the triage vital signs and the nursing notes.   HISTORY  Chief Complaint Headache  Level 5 caveat: Patient is a vague historian  HPI Nicholas Ali is a 55 y.o. male with a medical history most notable for ongoing alcohol use who presents for evaluation of headache.  He said that his headache started all of a sudden earlier tonight.  He says he has a history of migraines and this feels similar.  It is a sharp throbbing pain in the front of his head.  He denies any recent fall or other injury.  He has no light sensitivity.  He has not been nauseated.  He denies neck pain and neck stiffness.  He denies chest pain, shortness of breath, nausea, vomiting, abdominal pain, and dysuria.  He admits to some alcohol use earlier tonight.  Nothing in particular makes it feel better or worse although he says he feels better now than when he first arrived.     Past Medical History:  Diagnosis Date   Alcohol abuse    Epistaxis     Patient Active Problem List   Diagnosis Date Noted   Acute delirium 07/03/2017   Alcohol abuse 07/03/2017   History of head injury 07/03/2017    No past surgical history on file.  Prior to Admission medications   Medication Sig Start Date End Date Taking? Authorizing Provider  furosemide (LASIX) 20 MG tablet Take 1 tablet (20 mg total) by mouth daily. 12/26/18 01/25/19  Miguel Aschoff., MD  potassium chloride (K-DUR) 10 MEQ tablet Take 2 tablets (20 mEq total) by mouth daily. 12/26/18 01/25/19  Miguel Aschoff., MD    Allergies Patient has no known allergies.  No family history on file.  Social History Social History   Tobacco Use   Smoking status: Never   Smokeless tobacco: Former  Substance Use Topics   Alcohol use: Not  Currently   Drug use: No    Review of Systems Constitutional: No fever/chills Eyes: No visual changes. ENT: No sore throat. Cardiovascular: Denies chest pain. Respiratory: Denies shortness of breath. Gastrointestinal: No abdominal pain.  No nausea, no vomiting.  No diarrhea.  No constipation. Genitourinary: Negative for dysuria. Musculoskeletal: Negative for neck pain.  Negative for back pain. Integumentary: Negative for rash. Neurological: Positive for frontal headache.  Negative for focal numbness or weakness.   ____________________________________________   PHYSICAL EXAM:  VITAL SIGNS: ED Triage Vitals  Enc Vitals Group     BP 10/14/20 2348 121/86     Pulse Rate 10/14/20 2346 85     Resp 10/14/20 2346 16     Temp 10/14/20 2346 98.6 F (37 C)     Temp Source 10/14/20 2346 Oral     SpO2 10/14/20 2346 94 %     Weight 10/14/20 2347 127 kg (280 lb)     Height 10/14/20 2347 1.854 m (6\' 1" )     Head Circumference --      Peak Flow --      Pain Score 10/14/20 2347 10     Pain Loc --      Pain Edu? --      Excl. in [place]? --     Constitutional: Alert and oriented.  Eyes: Conjunctiva are injected bilaterally with muddy sclera but this seems to be chronic.  Pupils are equal and reactive.  Eyes appear to have a degree of strabismus. Head: Atraumatic. Nose: No congestion/rhinnorhea. Mouth/Throat: Patient is wearing a mask. Neck: No stridor.  No meningeal signs.   Cardiovascular: Normal rate, regular rhythm. Good peripheral circulation. Respiratory: Normal respiratory effort.  No retractions. Gastrointestinal: Soft and nontender. No distention.  Musculoskeletal: No lower extremity tenderness nor edema. No gross deformities of extremities. Neurologic:  Normal speech and language. No gross focal neurologic deficits are appreciated.  Skin:  Skin is warm, dry and intact. Psychiatric: Mood and affect are normal. Speech and behavior are  normal.  ____________________________________________   LABS (all labs ordered are listed, but only abnormal results are displayed)  Labs Reviewed  COMPREHENSIVE METABOLIC PANEL - Abnormal; Notable for the following components:      Result Value   Sodium 134 (*)    Calcium 8.8 (*)    AST 169 (*)    ALT 71 (*)    All other components within normal limits  CBC - Abnormal; Notable for the following components:   RDW 15.7 (*)    All other components within normal limits   ____________________________________________   RADIOLOGY I, Loleta Rose, personally viewed and evaluated these images (plain radiographs) as part of my medical decision making, as well as reviewing the written report by the radiologist.  ED MD interpretation:  No acute abnormalities on CT head  Official radiology report(s): CT Head Wo Contrast  Result Date: 10/15/2020 CLINICAL DATA:  55 year old male with acute headache and nausea tonight. EXAM: CT HEAD WITHOUT CONTRAST TECHNIQUE: Contiguous axial images were obtained from the base of the skull through the vertex without intravenous contrast. COMPARISON:  Head CT 07/03/2017. FINDINGS: Brain: Stable cerebral volume since 2019. Mega cisterna magna normal variant. No midline shift, ventriculomegaly, mass effect, evidence of mass lesion, intracranial hemorrhage or evidence of cortically based acute infarction. Gray-white matter differentiation is within normal limits throughout the brain. No encephalomalacia identified. Vascular: No suspicious intracranial vascular hyperdensity. Skull: Stable and negative. Sinuses/Orbits: Visualized paranasal sinuses and mastoids are stable and well aerated. Other: Disconjugate gaze similar to the 2019 CT. Otherwise negative orbit and scalp soft tissues. IMPRESSION: Negative noncontrast CT appearance of the brain, stable since 2019. Electronically Signed   By: Odessa Fleming M.D.   On: 10/15/2020 05:31     ____________________________________________   PROCEDURES   Procedure(s) performed (including Critical Care):  Procedures   ____________________________________________   INITIAL IMPRESSION / MDM / ASSESSMENT AND PLAN / ED COURSE  As part of my medical decision making, I reviewed the following data within the electronic MEDICAL RECORD NUMBER Nursing notes reviewed and incorporated, Labs reviewed , Old chart reviewed, and Notes from prior ED visits   Differential diagnosis includes, but is not limited to, migraine, side effect of alcohol or alcohol withdrawal, intracranial hemorrhage, less likely neoplasm.  Patient is generally well-appearing and in no distress.  He said his head feels better now than he did earlier.  Because of the cute onset of pain, I am getting a CT scan of the head.  His vital signs are stable and within normal limits.  His comprehensive metabolic panel is normal other than some mildly elevated LFTs which I suspect are due to his chronic alcohol use and noncontributory, particularly because he has no abdominal symptoms and no tenderness to palpation.  CBC is normal.  Patient is ambivalent about whether or not he gets any medication or what kind he gets (IV medicine for migraine versus Fioricet or  other oral medication).  We agreed to reassess after his CT scan.     Clinical Course as of 10/15/20 0620  Mon Oct 15, 2020  0814 CT scan is unremarkable.  I woke up the patient he said he feels much better and is ready to go home.  He said his head is not really hurting anymore.  I will give him without milligrams of Tylenol just to be safe and I encouraged him to follow-up as an outpatient.  I gave my usual and customary return precautions. [CF]    Clinical Course User Index [CF] Loleta Rose, MD     ____________________________________________  FINAL CLINICAL IMPRESSION(S) / ED DIAGNOSES  Final diagnoses:  Generalized headache  Elevated LFTs     MEDICATIONS  GIVEN DURING THIS VISIT:  Medications  acetaminophen (TYLENOL) tablet 1,000 mg (has no administration in time range)     ED Discharge Orders     None        Note:  This document was prepared using Dragon voice recognition software and may include unintentional dictation errors.   Loleta Rose, MD 10/15/20 [phone]

## 2020-10-15 NOTE — ED Notes (Signed)
Patient return from CT

## 2020-11-29 ENCOUNTER — Encounter: Payer: Self-pay | Admitting: *Deleted

## 2020-11-29 ENCOUNTER — Emergency Department
Admission: EM | Admit: 2020-11-29 | Discharge: 2020-11-29 | Disposition: A | Discharge status: Home / Self Care | Payer: Self-pay | Attending: Emergency Medicine | Admitting: Emergency Medicine

## 2020-11-29 ENCOUNTER — Other Ambulatory Visit: Payer: Self-pay

## 2020-11-29 DIAGNOSIS — F1010 Alcohol abuse, uncomplicated: Secondary | ICD-10-CM

## 2020-11-29 DIAGNOSIS — Z87828 Personal history of other (healed) physical injury and trauma: Secondary | ICD-10-CM

## 2020-11-29 DIAGNOSIS — Y905 Blood alcohol level of 100-119 mg/100 ml: Secondary | ICD-10-CM | POA: Insufficient documentation

## 2020-11-29 DIAGNOSIS — Z79899 Other long term (current) drug therapy: Secondary | ICD-10-CM | POA: Insufficient documentation

## 2020-11-29 DIAGNOSIS — F1020 Alcohol dependence, uncomplicated: Secondary | ICD-10-CM | POA: Insufficient documentation

## 2020-11-29 DIAGNOSIS — R410 Disorientation, unspecified: Secondary | ICD-10-CM | POA: Diagnosis present

## 2020-11-29 LAB — CBC
HCT: 40.8 % (ref 39.0–52.0)
Hemoglobin: 14.3 g/dL (ref 13.0–17.0)
MCH: 31 pg (ref 26.0–34.0)
MCHC: 35 g/dL (ref 30.0–36.0)
MCV: 88.3 fL (ref 80.0–100.0)
Platelets: 163 10*3/uL (ref 150–400)
RBC: 4.62 MIL/uL (ref 4.22–5.81)
RDW: 14.5 % (ref 11.5–15.5)
WBC: 4.4 10*3/uL (ref 4.0–10.5)
nRBC: 0 % (ref 0.0–0.2)

## 2020-11-29 LAB — COMPREHENSIVE METABOLIC PANEL
ALT: 46 U/L — ABNORMAL HIGH (ref 0–44)
AST: 100 U/L — ABNORMAL HIGH (ref 15–41)
Albumin: 4 g/dL (ref 3.5–5.0)
Alkaline Phosphatase: 70 U/L (ref 38–126)
Anion gap: 11 (ref 5–15)
BUN: 9 mg/dL (ref 6–20)
CO2: 24 mmol/L (ref 22–32)
Calcium: 8.6 mg/dL — ABNORMAL LOW (ref 8.9–10.3)
Chloride: 103 mmol/L (ref 98–111)
Creatinine, Ser: 0.98 mg/dL (ref 0.61–1.24)
GFR, Estimated: >60 mL/min (ref >60)
Glucose, Bld: 81 mg/dL (ref 70–99)
Potassium: 3.8 mmol/L (ref 3.5–5.1)
Sodium: 138 mmol/L (ref 135–145)
Total Bilirubin: 0.9 mg/dL (ref 0.3–1.2)
Total Protein: 7.8 g/dL (ref 6.5–8.1)

## 2020-11-29 LAB — URINE DRUG SCREEN, QUALITATIVE (ARMC ONLY)
Amphetamines, Ur Screen: NOT DETECTED
Barbiturates, Ur Screen: NOT DETECTED
Benzodiazepine, Ur Scrn: NOT DETECTED
Cannabinoid 50 Ng, Ur ~~LOC~~: NOT DETECTED
Cocaine Metabolite,Ur ~~LOC~~: NOT DETECTED
MDMA (Ecstasy)Ur Screen: NOT DETECTED
Methadone Scn, Ur: NOT DETECTED
Opiate, Ur Screen: NOT DETECTED
Phencyclidine (PCP) Ur S: NOT DETECTED
Tricyclic, Ur Screen: NOT DETECTED

## 2020-11-29 LAB — ETHANOL: Alcohol, Ethyl (B): 102 mg/dL — ABNORMAL HIGH (ref <10)

## 2020-11-29 LAB — ACETAMINOPHEN LEVEL: Acetaminophen (Tylenol), Serum: <10 ug/mL — ABNORMAL LOW (ref 10–30)

## 2020-11-29 LAB — SALICYLATE LEVEL: Salicylate Lvl: <7 mg/dL — ABNORMAL LOW (ref 7.0–30.0)

## 2020-11-29 MED ORDER — LORAZEPAM 2 MG/ML IJ SOLN: 0.0000 mg | Freq: Four times a day (QID) | INTRAMUSCULAR | Status: DC

## 2020-11-29 MED ORDER — LORAZEPAM 2 MG/ML IJ SOLN: 0.0000 mg | Freq: Two times a day (BID) | INTRAMUSCULAR | Status: DC

## 2020-11-29 MED ORDER — THIAMINE HCL 100 MG PO TABS: 100.0000 mg | ORAL_TABLET | Freq: Every day | ORAL | Status: DC

## 2020-11-29 MED ORDER — THIAMINE HCL 100 MG PO TABS: 100.0000 mg | ORAL_TABLET | Freq: Every day | ORAL | 3 refills | Status: AC

## 2020-11-29 MED ORDER — LORAZEPAM 2 MG PO TABS: 0.0000 mg | ORAL_TABLET | Freq: Four times a day (QID) | ORAL | Status: DC

## 2020-11-29 MED ORDER — NEPHRO-VITE RX 1 MG PO TABS: 1.0000 | ORAL_TABLET | Freq: Every day | ORAL | 3 refills | Status: AC

## 2020-11-29 MED ORDER — THIAMINE HCL 100 MG/ML IJ SOLN: 100.0000 mg | Freq: Every day | INTRAMUSCULAR | Status: DC

## 2020-11-29 MED ORDER — LORAZEPAM 2 MG PO TABS: 0.0000 mg | ORAL_TABLET | Freq: Two times a day (BID) | ORAL | Status: DC

## 2020-11-29 NOTE — Discharge Instructions (Signed)
You have been seen by the psychiatry team who does not feel you meet criteria for inpatient psychiatric treatment.  If you begin having thoughts of wanting to harm yourself or others, hallucinations, please return to the emergency department.  We have also offered you help with detox/rehab for alcohol abuse which you have declined.  We have provided you with a list of outpatient resources.   I reviewed all nursing notes and pertinent previous records as available.  I have reviewed and interpreted any EKGs, lab and urine results, imaging (as available).

## 2020-11-29 NOTE — ED Notes (Signed)
Two shirts, boots, pants, boxers, belt, hat, mobile phone, back pack, $30, socks.

## 2020-11-29 NOTE — Consult Note (Signed)
[facility] Face-to-Face Psychiatry Consult   Reason for Consult: Intoxicated Referring Physician: Dr. Elesa Massed Patient Identification: Nicholas Ali MRN:  [id number] Principal Diagnosis: <principal problem not specified> Diagnosis:  Active Problems:   Acute delirium   Alcohol abuse   History of head injury   Total Time spent with patient: 45 minutes  Subjective: "I drink. I drink every night." Nicholas Ali is a 55 y.o. male patient presented to [facility] ED via law enforcement under involuntary commitment status (IVC). The patient voiced no psychiatric history. The patient shared that he drinks, which he does every night. His BAL is 102mg /dl. He states being homeless and refutes his niece's claims that he lives in her residence. He says he goes to her house but does not live with her. 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 The patient was seen face-to-face by this provider; the chart was reviewed and consulted with Dr. on 11/29/2020 due to the patient's care. It was discussed with the EDP that the patient does not meet the criteria to be admitted to the psychiatric inpatient unit.  On evaluation, the patient is alert and oriented x 2-3, calm, intoxicated but cooperative, and mood-congruent with affect.  The patient does not appear to be responding to internal or external stimuli. Neither is the patient presenting with any delusional thinking. The patient denies auditory or visual hallucinations. The patient denies any suicidal, homicidal, or self-harm ideations. The patient is not presenting with any psychotic or paranoid behaviors.  Collateral attempted to contact the patient's niece Ms. Alexia Penick [phone], with no answer and no voicemail to leave a HIPPA-appropriate message for the patient niece. Patient psychiatrically cleared  HPI: Per Dr. [phone], Nicholas Ali is a 55 y.o. male with history of alcohol abuse who presents to the emergency  department under involuntary commitment that was taken out by patient's niece.  Per involuntary commitment paperwork, "respondent's niece reported that he has having night terrors, he is talking to himself, and he is in a constant state of impairment.  Respondent is refusing to wash himself and has been pooping on the side of the porch at her residence.  He has a history of depression and alcohol abuse."   Patient tells me that he drinks a 12 pack of beer every day.  Denies illicit drug use.  Denies any pain or any medical concerns.  He denies SI, HI or hallucinations.  He is not sure why he is here.  States that his niece called the police on him.  States he lives with his niece.  Past Psychiatric History:   Alcohol abuse  Risk to Self:   Risk to Others:   Prior Inpatient Therapy:   Prior Outpatient Therapy:    Past Medical History:  Past Medical History:  Diagnosis Date   Alcohol abuse    Epistaxis    History reviewed. No pertinent surgical history. Family History: No family history on file. Family Psychiatric  History:  Social History:  Social History   Substance and Sexual Activity  Alcohol Use Yes     Social History   Substance and Sexual Activity  Drug Use No    Social History   Socioeconomic History   Marital status: Single    Spouse name: Not on file   Number of children: Not on file   Years of education: Not on file   Highest education level: Not on file  Occupational History   Not on  file  Tobacco Use   Smoking status: Never   Smokeless tobacco: Former  Substance and Sexual Activity   Alcohol use: Yes   Drug use: No   Sexual activity: Not on file  Other Topics Concern   Not on file  Social History Narrative   Not on file   Social Determinants of Health   Financial Resource Strain: Not on file  Food Insecurity: Not on file  Transportation Needs: Not on file  Physical Activity: Not on file  Stress: Not on file  Social Connections: Not on file    Additional Social History:    Allergies:  No Known Allergies  Labs:  Results for orders placed or performed during the hospital encounter of 11/29/20 (from the past 48 hour(s))  Comprehensive metabolic panel     Status: Abnormal   Collection Time: 11/29/20  2:42 AM  Result Value Ref Range   Sodium 138 135 - 145 mmol/L   Potassium 3.8 3.5 - 5.1 mmol/L   Chloride 103 98 - 111 mmol/L   CO2 24 22 - 32 mmol/L   Glucose, Bld 81 70 - 99 mg/dL    Comment: Glucose reference range applies only to samples taken after fasting for at least 8 hours.   BUN 9 6 - 20 mg/dL   Creatinine, Ser 2.130.98 0.61 - 1.24 mg/dL   Calcium 8.6 (L) 8.9 - 10.3 mg/dL   Total Protein 7.8 6.5 - 8.1 g/dL   Albumin 4.0 3.5 - 5.0 g/dL   AST 086100 (H) 15 - 41 U/L   ALT 46 (H) 0 - 44 U/L   Alkaline Phosphatase 70 38 - 126 U/L   Total Bilirubin 0.9 0.3 - 1.2 mg/dL   GFR, Estimated >57>60 >84>60 mL/min    Comment: (NOTE) Calculated using the CKD-EPI Creatinine Equation (2021)    Anion gap 11 5 - 15    Comment: Performed at [facility], [address] [place], [place] [address]  cbc     Status: None   Collection Time: 11/29/20  2:42 AM  Result Value Ref Range   WBC 4.4 4.0 - 10.5 K/uL   RBC 4.62 4.22 - 5.81 MIL/uL   Hemoglobin 14.3 13.0 - 17.0 g/dL   HCT 52.840.8 41.339.0 - 24.452.0 %   MCV 88.3 80.0 - 100.0 fL   MCH 31.0 26.0 - 34.0 pg   MCHC 35.0 30.0 - 36.0 g/dL   RDW 01.014.5 27.211.5 - 53.615.5 %   Platelets 163 150 - 400 K/uL   nRBC 0.0 0.0 - 0.2 %    Comment: Performed at [facility], [address] [place], [place] [address]  Urine Drug Screen, Qualitative     Status: None   Collection Time: 11/29/20  2:42 AM  Result Value Ref Range   Tricyclic, Ur Screen NONE DETECTED NONE DETECTED   Amphetamines, Ur Screen NONE DETECTED NONE DETECTED   MDMA (Ecstasy)Ur Screen NONE DETECTED NONE DETECTED   Cocaine Metabolite,Ur [place] NONE DETECTED NONE DETECTED   Opiate, Ur Screen NONE DETECTED NONE DETECTED   Phencyclidine  (PCP) Ur S NONE DETECTED NONE DETECTED   Cannabinoid 50 Ng, Ur [place] NONE DETECTED NONE DETECTED   Barbiturates, Ur Screen NONE DETECTED NONE DETECTED   Benzodiazepine, Ur Scrn NONE DETECTED NONE DETECTED   Methadone Scn, Ur NONE DETECTED NONE DETECTED    Comment: (NOTE) Tricyclics + metabolites, urine    Cutoff 1000 ng/mL Amphetamines + metabolites, urine  Cutoff 1000 ng/mL MDMA (Ecstasy), urine  Cutoff 500 ng/mL Cocaine Metabolite, urine          Cutoff 300 ng/mL Opiate + metabolites, urine        Cutoff 300 ng/mL Phencyclidine (PCP), urine         Cutoff 25 ng/mL Cannabinoid, urine                 Cutoff 50 ng/mL Barbiturates + metabolites, urine  Cutoff 200 ng/mL Benzodiazepine, urine              Cutoff 200 ng/mL Methadone, urine                   Cutoff 300 ng/mL  The urine drug screen provides only a preliminary, unconfirmed analytical test result and should not be used for non-medical purposes. Clinical consideration and professional judgment should be applied to any positive drug screen result due to possible interfering substances. A more specific alternate chemical method must be used in order to obtain a confirmed analytical result. Gas chromatography / mass spectrometry (GC/MS) is the preferred confirm atory method. Performed at [facility], [address] [place], [place] [address]   Ethanol     Status: Abnormal   Collection Time: 11/29/20  3:17 AM  Result Value Ref Range   Alcohol, Ethyl (B) 102 (H) <10 mg/dL    Comment: (NOTE) Lowest detectable limit for serum alcohol is 10 mg/dL.  For medical purposes only. Performed at [facility], [address] [place], [place] [address]     Current Facility-Administered Medications  Medication Dose Route Frequency Provider Last Rate Last Admin   LORazepam (ATIVAN) injection 0-4 mg  0-4 mg Intravenous Q6H Ward, Kristen N, DO       Or   LORazepam (ATIVAN) tablet 0-4 mg  0-4 mg Oral Q6H  Ward, Kristen N, DO       [START ON 12/01/2020] LORazepam (ATIVAN) injection 0-4 mg  0-4 mg Intravenous Q12H Ward, Kristen N, DO       Or   [START ON 12/01/2020] LORazepam (ATIVAN) tablet 0-4 mg  0-4 mg Oral Q12H Ward, Kristen N, DO       thiamine tablet 100 mg  100 mg Oral Daily Ward, Kristen N, DO       Or   thiamine (B-1) injection 100 mg  100 mg Intravenous Daily Ward, Kristen N, DO       No current outpatient medications on file.    Musculoskeletal: Strength & Muscle Tone: within normal limits Gait & Station: normal Patient leans: N/A  Psychiatric Specialty Exam:  Presentation  General Appearance: Appropriate for Environment  Eye Contact:Minimal  Speech:Garbled  Speech Volume:Decreased  Handedness:Right   Mood and Affect  Mood:Euthymic  Affect:Appropriate   Thought Process  Thought Processes:Goal Directed  Descriptions of Associations:Loose  Orientation:Full (Time, Place and Person)  Thought Content:Logical  History of Schizophrenia/Schizoaffective disorder:No data recorded Duration of Psychotic Symptoms:No data recorded Hallucinations:Hallucinations: None  Ideas of Reference:None  Suicidal Thoughts:Suicidal Thoughts: No  Homicidal Thoughts:Homicidal Thoughts: No   Sensorium  Memory:Immediate Fair; Recent Fair; Remote Fair  Judgment:Fair  Insight:Fair   Executive Functions  Concentration:Fair  Attention Span:Fair  Recall:Fair  Fund of Knowledge:Fair  Language:Fair   Psychomotor Activity  Psychomotor Activity:Psychomotor Activity: Normal   Assets  Assets:Communication Skills; Housing; Resilience; Social Support   Sleep  Sleep:Sleep: Fair   Physical Exam: Physical Exam Vitals and nursing note reviewed.  Constitutional:      Appearance: Normal appearance. He is normal weight.  HENT:     Head: Normocephalic and atraumatic.     Nose: Nose normal.     Mouth/Throat:     Mouth: Mucous membranes are dry.  Cardiovascular:      Rate and Rhythm: Normal rate.     Pulses: Normal pulses.  Musculoskeletal:        General: Normal range of motion.     Cervical back: Normal range of motion and neck supple.  Neurological:     General: No focal deficit present.     Mental Status: He is alert and oriented to person, place, and time.  Psychiatric:        Attention and Perception: Attention and perception normal.        Mood and Affect: Mood normal. Affect is blunt.        Speech: Speech is slurred.        Behavior: Behavior normal. Behavior is cooperative.        Thought Content: Thought content normal.        Cognition and Memory: Cognition and memory normal.        Judgment: Judgment is inappropriate.   Review of Systems  Psychiatric/Behavioral:  Positive for substance abuse.   Blood pressure (!) 157/101, pulse 80, temperature 98.1 F (36.7 C), temperature source Oral, resp. rate 16, height 6\' 1"  (1.854 m), weight 86.2 kg, SpO2 95 %. Body mass index is 25.07 kg/m.  Treatment Plan Summary: Plan Patient psychiatrically cleared  Disposition: No evidence of imminent risk to self or others at present.   Patient does not meet criteria for psychiatric inpatient admission. Supportive therapy provided about ongoing stressors. Refer to IOP. Discussed crisis plan, support from social network, calling 911, coming to the Emergency Department, and calling Suicide Hotline.  , NP 11/29/2020 4:52 AM

## 2020-11-29 NOTE — ED Triage Notes (Signed)
Pt arrives with BPD under IVC. Pt reports ETOH use tonight, denies SI or HI, or hallucinations. Pt says he is not taking any medications.   Pt lives with his niece (okay to call if any questions). Pt has hx of depression, alcohol use, not taking care of himself.   IVC papers states that hs is having night terrors, talking to himself, constant state of impairment. Refusing to wash himself, pooping on the side of the porch at her residence.

## 2020-11-29 NOTE — BH Assessment (Signed)
Comprehensive Clinical Assessment (CCA) Note  11/29/2020 Nicholas Ali [id number]  Chief Complaint: Patient is a 55 year old male presenting to [facility] ED under IVC. Per triage note Pt arrives with BPD under IVC. Pt reports ETOH use tonight, denies SI or HI, or hallucinations. Pt says he is not taking any medications. Pt lives with his niece (okay to call if any questions). Pt has hx of depression, alcohol use, not taking care of himself. IVC papers states that hs is having night terrors, talking to himself, constant state of impairment. Refusing to wash himself, pooping on the side of the porch at her residence. During assessment patient appears alert and oriented x4, calm and cooperative, patient still visibly impaired and speech is slurred and rapid. Patient reports "the police came and talked to me." Patient does admit that he drinks alcohol and reports that he drinks daily "all I do is drink, since I was small." Patient did not recall using the bathroom outside of his niece's home and does report that he is currently homeless "I'm about to get a place though." Patient denies ever being hospitalization and denies taking any medications. Patient denies SI/HI/AH/VH and does not appear to be responding to any internal or external stimuli.  Per Psyc NP Elenore Paddy patient does not meet criteria for Inpatient Hospitalization  Chief Complaint  Patient presents with   Addiction Problem   Visit Diagnosis: Alcohol Use Disorder, severe    CCA Screening, Triage and Referral (STR)  Patient Reported Information How did you hear about [place]? Legal System  Referral name: No data recorded Referral phone number: No data recorded  Whom do you see for routine medical problems? No data recorded Practice/Facility Name: No data recorded Practice/Facility Phone Number: No data recorded Name of Contact: No data recorded Contact Number: No data recorded Contact Fax Number: No data recorded Prescriber Name: No  data recorded Prescriber Address (if known): No data recorded  What Is the Reason for Your Visit/Call Today? Patient presents to [facility] ED under IVC due to alcohol abuse  How Long Has This Been Causing You Problems? > than 6 months  What Do You Feel Would Help You the Most Today? No data recorded  Have You Recently Been in Any Inpatient Treatment (Hospital/Detox/Crisis Center/28-Day Program)? No data recorded Name/Location of Program/Hospital:No data recorded How Long Were You There? No data recorded When Were You Discharged? No data recorded  Have You Ever Received Services From [facility] Before? No data recorded Who Do You See at [facility]? No data recorded  Have You Recently Had Any Thoughts About Hurting Yourself? No  Are You Planning to Commit Suicide/Harm Yourself At This time? No   Have you Recently Had Thoughts About Hurting Someone Karolee Ohs? No  Explanation: No data recorded  Have You Used Any Alcohol or Drugs in the Past 24 Hours? Yes  How Long Ago Did You Use Drugs or Alcohol? No data recorded What Did You Use and How Much? Alcohol, Unknown amounts   Do You Currently Have a Therapist/Psychiatrist? No  Name of Therapist/Psychiatrist: No data recorded  Have You Been Recently Discharged From Any Office Practice or Programs? No  Explanation of Discharge From Practice/Program: No data recorded    CCA Screening Triage Referral Assessment Type of Contact: Face-to-Face  Is this Initial or Reassessment? No data recorded Date Telepsych consult ordered in [facility]:  No data recorded Time Telepsych consult ordered in [facility]L:  No data recorded  Patient Reported Information Reviewed? No data recorded  Patient Left Without Being Seen? No data recorded Reason for Not Completing Assessment: No data recorded  Collateral Involvement: No data recorded  Does Patient Have a Court Appointed Legal Guardian? No data recorded Name and Contact of Legal Guardian: No data recorded If Minor  and Not Living with Parent(s), Who has Custody? No data recorded Is CPS involved or ever been involved? Never  Is APS involved or ever been involved? Never   Patient Determined To Be At Risk for Harm To Self or Others Based on Review of Patient Reported Information or Presenting Complaint? No  Method: No data recorded Availability of Means: No data recorded Intent: No data recorded Notification Required: No data recorded Additional Information for Danger to Others Potential: No data recorded Additional Comments for Danger to Others Potential: No data recorded Are There Guns or Other Weapons in Your Home? No data recorded Types of Guns/Weapons: No data recorded Are These Weapons Safely Secured?                            No data recorded Who Could Verify You Are Able To Have These Secured: No data recorded Do You Have any Outstanding Charges, Pending Court Dates, Parole/Probation? No data recorded Contacted To Inform of Risk of Harm To Self or Others: No data recorded  Location of Assessment[facility] ED   Does Patient Present under Involuntary Commitment? Yes  IVC Papers Initial File Date: 11/29/20   [place]   Patient Currently Receiving the Following Services: No data recorded  Determination of Need: Emergent (2 hours)   Options For Referral: No data recorded    CCA Biopsychosocial Intake/Chief Complaint:  No data recorded Current Symptoms/Problems: No data recorded  Patient Reported Schizophrenia/Schizoaffective Diagnosis in Past: No   Strengths: Patient is able to communicate  Preferences: No data recorded Abilities: No data recorded  Type of Services Patient Feels are Needed: No data recorded  Initial Clinical Notes/Concerns: No data recorded  Mental Health Symptoms Depression:   None   Duration of Depressive symptoms: No data recorded  Mania:   None   Anxiety:    None   Psychosis:   None   Duration of Psychotic symptoms: No  data recorded  Trauma:   None   Obsessions:   None   Compulsions:   None   Inattention:   None   Hyperactivity/Impulsivity:   None   Oppositional/Defiant Behaviors:   None   Emotional Irregularity:   None   Other Mood/Personality Symptoms:  No data recorded   Mental Status Exam Appearance and self-care  Stature:   Average   Weight:   Average weight   Clothing:   Disheveled   Grooming:   Neglected   Cosmetic use:   None   Posture/gait:   Normal   Motor activity:   Not Remarkable   Sensorium  Attention:   Normal   Concentration:   Normal   Orientation:   X5   Recall/memory:   Normal   Affect and Mood  Affect:   Appropriate   Mood:   Other (Comment)   Relating  Eye contact:   Avoided   Facial expression:   Responsive   Attitude toward examiner:   Cooperative   Thought and Language  Speech flow:  Slurred   Thought content:   Appropriate to Mood and Circumstances   Preoccupation:   None   Hallucinations:   None   Organization:  No  data recorded  [employer] of Knowledge:   Fair   Intelligence:   Average   Abstraction:   Functional   Judgement:   Impaired   Reality Testing:   Unaware   Insight:   Lacking; Poor; None/zero insight   Decision Making:   Impulsive   Social Functioning  Social Maturity:   Isolates   Social Judgement:   Heedless   Stress  Stressors:   Family conflict; Housing; Office manager Ability:   Exhausted   Skill Deficits:   None   Supports:   Support needed     Religion: Religion/Spirituality Are You A Religious Person?: No  Leisure/Recreation: Leisure / Recreation Do You Have Hobbies?: No  Exercise/Diet: Exercise/Diet Do You Exercise?: No Have You Gained or Lost A Significant Amount of Weight in the Past Six Months?: No Do You Follow a Special Diet?: No Do You Have Any Trouble Sleeping?: No   CCA Employment/Education Employment/Work  Situation: Employment / Work Situation Employment Situation: Unemployed Has Patient ever Been in Equities trader?: No  Education: Education Did Theme park manager?: No Did You Have An Individualized Education Program (IIEP): No Did You Have Any Difficulty At [employer]?: No Patient's Education Has Been Impacted by Current Illness: No   CCA Family/Childhood History Family and Relationship History: Family history Marital status: Single  Childhood History:  Childhood History Did patient suffer any verbal/emotional/physical/sexual abuse as a child?: No Did patient suffer from severe childhood neglect?: No Has patient ever been sexually abused/assaulted/raped as an adolescent or adult?: No Was the patient ever a victim of a crime or a disaster?: No Witnessed domestic violence?: No Has patient been affected by domestic violence as an adult?: No  Child/Adolescent Assessment:     CCA Substance Use Alcohol/Drug Use: Alcohol / Drug Use Pain Medications: See MAR Prescriptions: See MAR Over the Counter: See MAR History of alcohol / drug use?: Yes Substance #1 Name of Substance 1: Alcohol 1 - Amount (size/oz): Unknown 1 - Frequency: daily 1 - Last Use / Amount: 11/29/20                       ASAM's:  Six Dimensions of Multidimensional Assessment  Dimension 1:  Acute Intoxication and/or Withdrawal Potential:      Dimension 2:  Biomedical Conditions and Complications:      Dimension 3:  Emotional, Behavioral, or Cognitive Conditions and Complications:     Dimension 4:  Readiness to Change:     Dimension 5:  Relapse, Continued use, or Continued Problem Potential:     Dimension 6:  Recovery/Living Environment:     ASAM Severity Score:    ASAM Recommended Level of Treatment:     Substance use Disorder (SUD) Substance Use Disorder (SUD)  Checklist Symptoms of Substance Use: Continued use despite having a persistent/recurrent physical/psychological problem caused/exacerbated  by use, Evidence of tolerance, Presence of craving or strong urge to use, Social, occupational, recreational activities given up or reduced due to use, Recurrent use that results in a failure to fulfill major role obligations (work, school, home), Large amounts of time spent to obtain, use or recover from the substance(s), Substance(s) often taken in larger amounts or over longer times than was intended, Continued use despite persistent or recurrent social, interpersonal problems, caused or exacerbated by use, Persistent desire or unsuccessful efforts to cut down or control use, Repeated use in physically hazardous situations  Recommendations for Services/Supports/Treatments:  Discharge  DSM5 Diagnoses: Patient  Active Problem List   Diagnosis Date Noted   Acute delirium 07/03/2017   Alcohol abuse 07/03/2017   History of head injury 07/03/2017    Patient Centered Plan: Patient is on the following Treatment Plan(s):  Substance Abuse   Referrals to Alternative Service(s): Referred to Alternative Service(s):   Place:   Date:   Time:    Referred to Alternative Service(s):   Place:   Date:   Time:    Referred to Alternative Service(s):   Place:   Date:   Time:    Referred to Alternative Service(s):   Place:   Date:   Time:     Nicholas Ali A Samie Reasons, LCAS-A

## 2020-11-29 NOTE — ED Provider Notes (Signed)
[facility] Emergency Department Provider Note  ____________________________________________   Event Date/Time   First MD Initiated Contact with Patient 11/29/20 [phone]     (approximate)  I have reviewed the triage vital signs and the nursing notes.   HISTORY  Chief Complaint Addiction Problem    HPI Nicholas Ali is a 55 y.o. male with history of alcohol abuse who presents to the emergency department under involuntary commitment that was taken out by patient's niece.  Per involuntary commitment paperwork, "respondent's niece reported that he has having night terrors, he is talking to himself, and he is in a constant state of impairment.  Respondent is refusing to wash himself and has been pooping on the side of the porch at her residence.  He has a history of depression and alcohol abuse."  Patient tells me that he drinks a 12 pack of beer every day.  Denies illicit drug use.  Denies any pain or any medical concerns.  He denies SI, HI or hallucinations.  He is not sure why he is here.  States that his niece called the police on him.  States he lives with his niece.      Niece - Obadiah Dennard - [phone]  Past Medical History:  Diagnosis Date   Alcohol abuse    Epistaxis     Patient Active Problem List   Diagnosis Date Noted   Acute delirium 07/03/2017   Alcohol abuse 07/03/2017   History of head injury 07/03/2017    History reviewed. No pertinent surgical history.  Prior to Admission medications   Medication Sig Start Date End Date Taking? Authorizing Provider  B Complex-C-Folic Acid (B COMPLEX-VITAMIN C-FOLIC ACID) 1 MG tablet Take 1 tablet by mouth daily with breakfast. 11/29/20  Yes Raine Elsass, Baxter Hire N, DO  thiamine 100 MG tablet Take 1 tablet (100 mg total) by mouth daily. 11/29/20  Yes Alanzo Lamb, Layla Maw, DO    Allergies Patient has no known allergies.  No family history on file.  Social History Social History   Tobacco Use    Smoking status: Never   Smokeless tobacco: Former  Substance Use Topics   Alcohol use: Yes   Drug use: No    Review of Systems Constitutional: No fever. Eyes: No visual changes. ENT: No sore throat. Cardiovascular: Denies chest pain. Respiratory: Denies shortness of breath. Gastrointestinal: No nausea, vomiting, diarrhea. Genitourinary: Negative for dysuria. Musculoskeletal: Negative for back pain. Skin: Negative for rash. Neurological: Negative for focal weakness or numbness.  ____________________________________________   PHYSICAL EXAM:  VITAL SIGNS: ED Triage Vitals  Enc Vitals Group     BP 11/29/20 0227 (!) 157/101     Pulse Rate 11/29/20 0227 80     Resp 11/29/20 0227 16     Temp 11/29/20 0227 98.1 F (36.7 C)     Temp Source 11/29/20 0227 Oral     SpO2 11/29/20 0227 95 %     Weight 11/29/20 0228 190 lb (86.2 kg)     Height 11/29/20 0228 6\' 1"  (1.854 m)     Head Circumference --      Peak Flow --      Pain Score 11/29/20 0227 0     Pain Loc --      Pain Edu? --      Excl. in GC? --    CONSTITUTIONAL: Alert and oriented and responds appropriately to questions. Well-appearing; well-nourished, appears intoxicated HEAD: Normocephalic EYES: Conjunctivae clear, pupils appear equal, EOM appear intact ENT: normal  nose; moist mucous membranes NECK: Supple, normal ROM CARD: RRR; S1 and S2 appreciated; no murmurs, no clicks, no rubs, no gallops RESP: Normal chest excursion without splinting or tachypnea; breath sounds clear and equal bilaterally; no wheezes, no rhonchi, no rales, no hypoxia or respiratory distress, speaking full sentences ABD/GI: Normal bowel sounds; non-distended; soft, non-tender, no rebound, no guarding, no peritoneal signs, no hepatosplenomegaly BACK: The back appears normal EXT: Normal ROM in all joints; no deformity noted, no edema; no cyanosis SKIN: Normal color for age and race; warm; no rash on exposed skin NEURO: Moves all extremities  equally, normal gait, normal speech PSYCH: The patient's mood and manner are appropriate.  ____________________________________________   LABS (all labs ordered are listed, but only abnormal results are displayed)  Labs Reviewed  COMPREHENSIVE METABOLIC PANEL - Abnormal; Notable for the following components:      Result Value   Calcium 8.6 (*)    AST 100 (*)    ALT 46 (*)    All other components within normal limits  ETHANOL - Abnormal; Notable for the following components:   Alcohol, Ethyl (B) 102 (*)    All other components within normal limits  RESP PANEL BY RT-PCR (FLU A&B, COVID) ARPGX2  CBC  URINE DRUG SCREEN, QUALITATIVE ([facility] ONLY)  ACETAMINOPHEN LEVEL  SALICYLATE LEVEL   ____________________________________________  EKG   ____________________________________________  RADIOLOGY I, Mailen Newborn, personally viewed and evaluated these images (plain radiographs) as part of my medical decision making, as well as reviewing the written report by the radiologist.  ED MD interpretation:    Official radiology report(s): No results found.  ____________________________________________   PROCEDURES  Procedure(s) performed (including Critical Care):  Procedures    ____________________________________________   INITIAL IMPRESSION / ASSESSMENT AND PLAN / ED COURSE  As part of my medical decision making, I reviewed the following data within the electronic MEDICAL RECORD NUMBER Nursing notes reviewed and incorporated, Labs reviewed , Old chart reviewed, Notes from prior ED visits, and [place] Controlled Substance Database, [facility] consulted         Patient here under involuntary commitment.  Per paperwork states that patient has been abusing alcohol and has been refusing to bathe himself and has been pooping on the side of the porch at his nieces residence.  Also reports he has been talking to himself but he denies that he is having any hallucinations.  He also denies SI, HI.   I have tried to contact the niece for collateral information but she does not answer the phone and her voice mailbox is full.  We will consult TTS and psychiatry for further evaluation.  Screening labs pending.  Patient states at this time that he does not want help with his drinking.  ED PROGRESS  Patient seen by Gillermo Murdoch, NP with psychiatry.  At this time he does not meet criteria for inpatient psychiatric treatment and he declines help with detox/rehab.  6:50 AM  Pt resting comfortably and appears clinically sober.  Labs show no acute abnormality.  Minimally elevated AST and ALT that actually look better than previous.  He continues to deny SI, HI.  I have attempted to contact his niece 3 times and psychiatry has attempted to contact her as well for collateral information.  Given IVC paperwork does not mention anything about SI, HI and he denies the same and is able to contract for safety, will rescind his IVC and discharge home.  Provided with outpatient resources.  At this time, I do  not feel there is any life-threatening condition present. I have reviewed, interpreted and discussed all results (EKG, imaging, lab, urine as appropriate) and exam findings with patient/family. I have reviewed nursing notes and appropriate previous records.  I feel the patient is safe to be discharged home without further emergent workup and can continue workup as an outpatient as needed. Discussed usual and customary return precautions. Patient/family verbalize understanding and are comfortable with this plan.  Outpatient follow-up has been provided as needed. All questions have been answered.  ____________________________________________   FINAL CLINICAL IMPRESSION(S) / ED DIAGNOSES  Final diagnoses:  Alcohol abuse     ED Discharge Orders          Ordered    B Complex-C-Folic Acid (B COMPLEX-VITAMIN C-FOLIC ACID) 1 MG tablet  Daily with breakfast        11/29/20 0655    thiamine 100 MG tablet   Daily        11/29/20 0655            *Please note:  Brylen Wagar was evaluated in Emergency Department on 11/29/2020 for the symptoms described in the history of present illness. He was evaluated in the context of the global COVID-19 pandemic, which necessitated consideration that the patient might be at risk for infection with the SARS-CoV-2 virus that causes COVID-19. Institutional protocols and algorithms that pertain to the evaluation of patients at risk for COVID-19 are in a state of rapid change based on information released by regulatory bodies including the CDC and federal and state organizations. These policies and algorithms were followed during the patient's care in the ED.  Some ED evaluations and interventions may be delayed as a result of limited staffing during and the pandemic.*   Note:  This document was prepared using Dragon voice recognition software and may include unintentional dictation errors.    Allysen Lazo, Layla Maw, DO 11/29/20 [phone]

## 2020-11-29 NOTE — ED Notes (Signed)

## 2020-11-29 NOTE — ED Notes (Signed)
IVC/Consult completed/ Pending Reassessment

## 2020-12-25 ENCOUNTER — Emergency Department
Admission: EM | Admit: 2020-12-25 | Discharge: 2020-12-26 | Disposition: A | Discharge status: Home / Self Care | Payer: Medicaid Other | Attending: Emergency Medicine | Admitting: Emergency Medicine

## 2020-12-25 ENCOUNTER — Encounter: Payer: Self-pay | Admitting: Emergency Medicine

## 2020-12-25 ENCOUNTER — Other Ambulatory Visit: Payer: Self-pay

## 2020-12-25 DIAGNOSIS — F10129 Alcohol abuse with intoxication, unspecified: Secondary | ICD-10-CM | POA: Insufficient documentation

## 2020-12-25 DIAGNOSIS — F10920 Alcohol use, unspecified with intoxication, uncomplicated: Secondary | ICD-10-CM

## 2020-12-25 DIAGNOSIS — Y908 Blood alcohol level of 240 mg/100 ml or more: Secondary | ICD-10-CM | POA: Insufficient documentation

## 2020-12-25 DIAGNOSIS — Z20822 Contact with and (suspected) exposure to covid-19: Secondary | ICD-10-CM | POA: Insufficient documentation

## 2020-12-25 DIAGNOSIS — Z87891 Personal history of nicotine dependence: Secondary | ICD-10-CM | POA: Insufficient documentation

## 2020-12-25 DIAGNOSIS — Z79899 Other long term (current) drug therapy: Secondary | ICD-10-CM | POA: Insufficient documentation

## 2020-12-25 DIAGNOSIS — F1910 Other psychoactive substance abuse, uncomplicated: Secondary | ICD-10-CM | POA: Insufficient documentation

## 2020-12-25 MED ORDER — THIAMINE HCL 100 MG PO TABS
100.0000 mg | ORAL_TABLET | Freq: Every day | ORAL | Status: DC
  Administered 2020-12-26: 100 mg via ORAL
  Filled 2020-12-25: qty 1

## 2020-12-25 MED ORDER — LORAZEPAM 2 MG PO TABS
0.0000 mg | ORAL_TABLET | Freq: Four times a day (QID) | ORAL | Status: DC
  Administered 2020-12-26: 1 mg via ORAL
  Filled 2020-12-25: qty 1

## 2020-12-25 NOTE — ED Triage Notes (Signed)
Pt arrives requesting detox, says he uses "lots of drugs" cocaine, marijuana, heroin. Last used last week. ETOH use today. Reports hallucinations "for two years". When asked about SI, says "I don't know", answers no to specific screening questions. Denies HI.    Pt brought in by niece whom says he agreed to come voluntarily tonight. he lives with her and says he needs help with his drug use, that he has been hearing things at home that are not there, waking up with night terrors. She can be contacted at [phone] with questions.

## 2020-12-25 NOTE — ED Notes (Signed)
All triage information, including triage note, vitals and screening questions were obtained by this RN, Kerrie Pleasure, not Kathyrn Sheriff, RN

## 2020-12-25 NOTE — ED Provider Notes (Signed)
[facility] Emergency Department Provider Note   ____________________________________________   Event Date/Time   First MD Initiated Contact with Patient 12/25/20 2343     (approximate)  I have reviewed the triage vital signs and the nursing notes.   HISTORY  Chief Complaint Behavioral Medicine Evaluation    HPI Nicholas Ali is a 55 y.o. male who presents to the ED requesting behavioral medicine evaluation and detox.  Patient with a history of alcohol abuse.  States he uses "lots of drugs" including cocaine, marijuana, heroin and EtOH.  Last used cocaine 2 weeks ago.  Last use EtOH this morning.  Reports hallucinations for years.  Endorses vague suicidal ideation without plan.  Denies HI/AH/VH.  Voices no medical complaints.     Past Medical History:  Diagnosis Date   Alcohol abuse    Epistaxis     Patient Active Problem List   Diagnosis Date Noted   Acute delirium 07/03/2017   Alcohol abuse 07/03/2017   History of head injury 07/03/2017    History reviewed. No pertinent surgical history.  Prior to Admission medications   Medication Sig Start Date End Date Taking? Authorizing Provider  B Complex-C-Folic Acid (B COMPLEX-VITAMIN C-FOLIC ACID) 1 MG tablet Take 1 tablet by mouth daily with breakfast. 11/29/20   Ward, Layla Maw, DO  thiamine 100 MG tablet Take 1 tablet (100 mg total) by mouth daily. 11/29/20   Ward, Layla Maw, DO    Allergies Patient has no known allergies.  No family history on file.  Social History Social History   Tobacco Use   Smoking status: Never   Smokeless tobacco: Former  Substance Use Topics   Alcohol use: Yes   Drug use: No    Review of Systems  Constitutional: No fever/chills Eyes: No visual changes. ENT: No sore throat. Cardiovascular: Denies chest pain. Respiratory: Denies shortness of breath. Gastrointestinal: No abdominal pain.  No nausea, no vomiting.  No diarrhea.  No constipation. Genitourinary:  Negative for dysuria. Musculoskeletal: Negative for back pain. Skin: Negative for rash. Neurological: Negative for headaches, focal weakness or numbness. Psychiatric: Positive for polysubstance use, hallucinations, SI.  ____________________________________________   PHYSICAL EXAM:  VITAL SIGNS: ED Triage Vitals  Enc Vitals Group     BP 12/25/20 2329 (!) 127/93     Pulse Rate 12/25/20 2329 84     Resp 12/25/20 2329 18     Temp 12/25/20 2329 98.9 F (37.2 C)     Temp Source 12/25/20 2329 Oral     SpO2 12/25/20 2329 96 %     Weight 12/25/20 2326 180 lb (81.6 kg)     Height 12/25/20 2326 6\' 1"  (1.854 m)     Head Circumference --      Peak Flow --      Pain Score 12/25/20 2326 0     Pain Loc --      Pain Edu? --      Excl. in GC? --     Constitutional: Alert and oriented.  Disheveled appearing and in no acute distress. Eyes: Conjunctivae are reddened bilaterally. PERRL. EOMI. Head: Atraumatic. Nose: No congestion/rhinnorhea. Mouth/Throat: Mucous membranes are moist.   Neck: No stridor.   Cardiovascular: Normal rate, regular rhythm. Grossly normal heart sounds.  Good peripheral circulation. Respiratory: Normal respiratory effort.  No retractions. Lungs CTAB. Gastrointestinal: Soft and nontender. No distention. No abdominal bruits. No CVA tenderness. Musculoskeletal: No lower extremity tenderness nor edema.  No joint effusions. Neurologic: Mumbling speech and language. No  gross focal neurologic deficits are appreciated. No gait instability. Skin:  Skin is warm, dry and intact. No rash noted. Psychiatric: Mood and affect are flat. Speech and behavior are normal.  ____________________________________________   LABS (all labs ordered are listed, but only abnormal results are displayed)  Labs Reviewed  COMPREHENSIVE METABOLIC PANEL - Abnormal; Notable for the following components:      Result Value   Sodium 134 (*)    Calcium 8.8 (*)    AST 123 (*)    All other components  within normal limits  ETHANOL - Abnormal; Notable for the following components:   Alcohol, Ethyl (B) 332 (*)    All other components within normal limits  SALICYLATE LEVEL - Abnormal; Notable for the following components:   Salicylate Lvl <7.0 (*)    All other components within normal limits  ACETAMINOPHEN LEVEL - Abnormal; Notable for the following components:   Acetaminophen (Tylenol), Serum <10 (*)    All other components within normal limits  CBC  URINE DRUG SCREEN, QUALITATIVE ([facility] ONLY)   ____________________________________________  EKG  ED ECG REPORT I, Nike Southers J, the attending physician, personally viewed and interpreted this ECG.   Date: 12/25/2020  EKG Time: 2353  Rate: 76  Rhythm: normal sinus rhythm  Axis: Normal  Intervals:none  ST&T Change: Nonspecific  ____________________________________________  RADIOLOGY I, Latesha Chesney J, personally viewed and evaluated these images (plain radiographs) as part of my medical decision making, as well as reviewing the written report by the radiologist.  ED MD interpretation: None  Official radiology report(s): No results found.  ____________________________________________   PROCEDURES  Procedure(s) performed (including Critical Care):  Procedures   ____________________________________________   INITIAL IMPRESSION / ASSESSMENT AND PLAN / ED COURSE  As part of my medical decision making, I reviewed the following data within the electronic MEDICAL RECORD NUMBER Nursing notes reviewed and incorporated, Labs reviewed, EKG interpreted, Old chart reviewed, A consult was requested and obtained from this/these consultant(s) Psychiatry, and Notes from prior ED visits     55 year old male presents for behavioral medicine evaluation/detox.  Will place on CIWA scale.  Obtain EKG given patient's history of cocaine use.  The patient has been placed in psychiatric observation due to the need to provide a safe environment for the  patient while obtaining psychiatric consultation and evaluation, as well as ongoing medical and medication management to treat the patient's condition.  The patient has not been placed under full IVC at this time.       ____________________________________________   FINAL CLINICAL IMPRESSION(S) / ED DIAGNOSES  Final diagnoses:  Substance abuse (HCC)  Alcoholic intoxication without complication [facility])     ED Discharge Orders     None        Note:  This document was prepared using Dragon voice recognition software and may include unintentional dictation errors.    Irean Hong, MD 12/26/20 [phone]

## 2020-12-26 LAB — CBC
HCT: 39.6 % (ref 39.0–52.0)
Hemoglobin: 14 g/dL (ref 13.0–17.0)
MCH: 31 pg (ref 26.0–34.0)
MCHC: 35.4 g/dL (ref 30.0–36.0)
MCV: 87.8 fL (ref 80.0–100.0)
Platelets: 161 10*3/uL (ref 150–400)
RBC: 4.51 MIL/uL (ref 4.22–5.81)
RDW: 13.8 % (ref 11.5–15.5)
WBC: 4.4 10*3/uL (ref 4.0–10.5)
nRBC: 0 % (ref 0.0–0.2)

## 2020-12-26 LAB — URINE DRUG SCREEN, QUALITATIVE (ARMC ONLY)
Amphetamines, Ur Screen: NOT DETECTED
Barbiturates, Ur Screen: NOT DETECTED
Benzodiazepine, Ur Scrn: NOT DETECTED
Cannabinoid 50 Ng, Ur ~~LOC~~: NOT DETECTED
Cocaine Metabolite,Ur ~~LOC~~: NOT DETECTED
MDMA (Ecstasy)Ur Screen: NOT DETECTED
Methadone Scn, Ur: NOT DETECTED
Opiate, Ur Screen: NOT DETECTED
Phencyclidine (PCP) Ur S: NOT DETECTED
Tricyclic, Ur Screen: NOT DETECTED

## 2020-12-26 LAB — COMPREHENSIVE METABOLIC PANEL
ALT: 43 U/L (ref 0–44)
AST: 123 U/L — ABNORMAL HIGH (ref 15–41)
Albumin: 3.9 g/dL (ref 3.5–5.0)
Alkaline Phosphatase: 60 U/L (ref 38–126)
Anion gap: 12 (ref 5–15)
BUN: 7 mg/dL (ref 6–20)
CO2: 23 mmol/L (ref 22–32)
Calcium: 8.8 mg/dL — ABNORMAL LOW (ref 8.9–10.3)
Chloride: 99 mmol/L (ref 98–111)
Creatinine, Ser: 0.96 mg/dL (ref 0.61–1.24)
GFR, Estimated: >60 mL/min (ref >60)
Glucose, Bld: 96 mg/dL (ref 70–99)
Potassium: 3.7 mmol/L (ref 3.5–5.1)
Sodium: 134 mmol/L — ABNORMAL LOW (ref 135–145)
Total Bilirubin: 0.9 mg/dL (ref 0.3–1.2)
Total Protein: 7.8 g/dL (ref 6.5–8.1)

## 2020-12-26 LAB — RESP PANEL BY RT-PCR (FLU A&B, COVID) ARPGX2
Influenza A by PCR: NEGATIVE
Influenza B by PCR: NEGATIVE
SARS Coronavirus 2 by RT PCR: NEGATIVE

## 2020-12-26 LAB — ACETAMINOPHEN LEVEL: Acetaminophen (Tylenol), Serum: <10 ug/mL — ABNORMAL LOW (ref 10–30)

## 2020-12-26 LAB — ETHANOL: Alcohol, Ethyl (B): 332 mg/dL (ref <10)

## 2020-12-26 LAB — SALICYLATE LEVEL: Salicylate Lvl: <7 mg/dL — ABNORMAL LOW (ref 7.0–30.0)

## 2020-12-26 NOTE — Discharge Instructions (Addendum)
[facility] is here to get you.  Please go with them they will work on your detox.  They will help you with your drinking and cocaine problems.

## 2020-12-26 NOTE — ED Notes (Signed)
Breakfast with hydration services w/o incident.  A.M. medications given as well, pt tolerated well. Staff informed pt of new snack schedule pt verbalized understanding  

## 2020-12-26 NOTE — ED Notes (Signed)
VOL/pending psych consult 

## 2020-12-26 NOTE — BH Assessment (Signed)
Comprehensive Clinical Assessment (CCA) Note  12/26/2020 Nicholas Ali [id number] Recommendations for Services/Supports/Treatments: Disposition: Pt. is recommended for substance abuse treatment/detox. Substance abuse facilities will be contacted for placement.    Nicholas Ali is a 55 year old, is an [place] speaking, black male who presented to [facility] ED voluntarily requesting detox. Pt admitted to using alcohol on the 12/26/20. Pt is unemployed and lives with his niece. Pt denies current SI, HI, AV/H, NSSIB, or access to guns/weapons. Pt reports a hx of previous inpatient substance treatment. Pt reported that he had a clean time of around 6 months. Pt has a hx of depression and reports that he is not connected to a psychiatrist or therapist and is not on any psychotropic medications. Pt denies any upcoming court dates. Pt is oriented x5. Pt expressed a desire to change, get off alcohol and live a sober lifestyle.  Pt was drowsy with slurred speech. Pt's thought processes were relevant and coherent. Pt reported that he drinks 1-3 alcoholic beers, daily. Pt denies all other substance use. Pt's BAL was 332 upon arrival. Pt reports withdrawal symptoms of dizziness.  Chief Complaint: No chief complaint on file.  Visit Diagnosis: Alcohol use disorder severe    CCA Screening, Triage and Referral (STR)  Patient Reported Information How did you hear about [place]? Family/Friend  Referral name: No data recorded Referral phone number: No data recorded  Whom do you see for routine medical problems? No data recorded Practice/Facility Name: No data recorded Practice/Facility Phone Number: No data recorded Name of Contact: No data recorded Contact Number: No data recorded Contact Fax Number: No data recorded Prescriber Name: No data recorded Prescriber Address (if known): No data recorded  What Is the Reason for Your Visit/Call Today? Pt presents to [facility] ED voluntarily due to alcohol abuse.  How Long Has  This Been Causing You Problems? > than 6 months  What Do You Feel Would Help You the Most Today? Alcohol or Drug Use Treatment   Have You Recently Been in Any Inpatient Treatment (Hospital/Detox/Crisis Center/28-Day Program)? No data recorded Name/Location of Program/Hospital:No data recorded How Long Were You There? No data recorded When Were You Discharged? No data recorded  Have You Ever Received Services From [facility] Before? No data recorded Who Do You See at [facility]? No data recorded  Have You Recently Had Any Thoughts About Hurting Yourself? No  Are You Planning to Commit Suicide/Harm Yourself At This time? No   Have you Recently Had Thoughts About Hurting Someone Nicholas Ali? No  Explanation: No data recorded  Have You Used Any Alcohol or Drugs in the Past 24 Hours? Yes  How Long Ago Did You Use Drugs or Alcohol? No data recorded What Did You Use and How Much? ETOH; Unknown amount   Do You Currently Have a Therapist/Psychiatrist? No  Name of Therapist/Psychiatrist: No data recorded  Have You Been Recently Discharged From Any Office Practice or Programs? No  Explanation of Discharge From Practice/Program: No data recorded    CCA Screening Triage Referral Assessment Type of Contact: Face-to-Face  Is this Initial or Reassessment? No data recorded Date Telepsych consult ordered in [facility]:  No data recorded Time Telepsych consult ordered in [facility]:  No data recorded  Patient Reported Information Reviewed? No data recorded Patient Left Without Being Seen? No data recorded Reason for Not Completing Assessment: No data recorded  Collateral Involvement: No data recorded  Does Patient Have a Court Appointed Legal Guardian? No data recorded Name and Contact of Legal Guardian:  No data recorded If Minor and Not Living with Parent(s), Who has Custody? No data recorded Is CPS involved or ever been involved? Never  Is APS involved or ever been involved? Never   Patient  Determined To Be At Risk for Harm To Self or Others Based on Review of Patient Reported Information or Presenting Complaint? No  Method: No data recorded Availability of Means: No data recorded Intent: No data recorded Notification Required: No data recorded Additional Information for Danger to Others Potential: No data recorded Additional Comments for Danger to Others Potential: No data recorded Are There Guns or Other Weapons in Your Home? No data recorded Types of Guns/Weapons: No data recorded Are These Weapons Safely Secured?                            No data recorded Who Could Verify You Are Able To Have These Secured: No data recorded Do You Have any Outstanding Charges, Pending Court Dates, Parole/Probation? No data recorded Contacted To Inform of Risk of Harm To Self or Others: No data recorded  Location of Assessment: [facility] ED   Does Patient Present under Involuntary Commitment? Yes  IVC Papers Initial File Date: 11/29/20   [place] of Residence: [place]   Patient Currently Receiving the Following Services: Not Receiving Services   Determination of Need: Urgent (48 hours)   Options For Referral: Other: Comment (Substance abuse/detox)     CCA Biopsychosocial Intake/Chief Complaint:  No data recorded Current Symptoms/Problems: No data recorded  Patient Reported Schizophrenia/Schizoaffective Diagnosis in Past: No   Strengths: Patient is able to communicate  Preferences: No data recorded Abilities: No data recorded  Type of Services Patient Feels are Needed: No data recorded  Initial Clinical Notes/Concerns: No data recorded  Mental Health Symptoms Depression:   None   Duration of Depressive symptoms: No data recorded  Mania:   None   Anxiety:    None   Psychosis:   None   Duration of Psychotic symptoms: No data recorded  Trauma:   None   Obsessions:   None   Compulsions:   None   Inattention:   None   Hyperactivity/Impulsivity:    None   Oppositional/Defiant Behaviors:   None   Emotional Irregularity:   None   Other Mood/Personality Symptoms:  No data recorded   Mental Status Exam Appearance and self-care  Stature:   Average   Weight:   Average weight   Clothing:   Disheveled   Grooming:   Neglected   Cosmetic use:   None   Posture/gait:   Normal   Motor activity:   Not Remarkable   Sensorium  Attention:   Normal   Concentration:   Normal   Orientation:   X5   Recall/memory:   Normal   Affect and Mood  Affect:   Appropriate   Mood:   Other (Comment)   Relating  Eye contact:   Normal   Facial expression:   Responsive   Attitude toward examiner:   Cooperative   Thought and Language  Speech flow:  Slurred   Thought content:   Appropriate to Mood and Circumstances   Preoccupation:   None   Hallucinations:   None   Organization:  No data recorded  [employer] of Knowledge:   Fair   Intelligence:   Average   Abstraction:   Functional   Judgement:   Impaired   Reality Testing:   Adequate  Insight:   Shallow; Present   Decision Making:   Normal   Social Functioning  Social Maturity:   Isolates   Social Judgement:   Heedless   Stress  Stressors:   Family conflict; Housing; Office manager Ability:   Exhausted   Skill Deficits:   None   Supports:   Support needed     Religion: Religion/Spirituality Are You A Religious Person?: No  Leisure/Recreation: Leisure / Recreation Do You Have Hobbies?: No  Exercise/Diet: Exercise/Diet Do You Exercise?: No Have You Gained or Lost A Significant Amount of Weight in the Past Six Months?: No Do You Follow a Special Diet?: No Do You Have Any Trouble Sleeping?: No   CCA Employment/Education Employment/Work Situation: Employment / Work Situation Employment Situation: Unemployed Patient's Job has Been Impacted by Current Illness: No Has Patient ever Been in Product manager?: No  Education: Education Is Patient Currently Attending School?: No Did Theme park manager?: No Did You Have An Individualized Education Program (IIEP): No Did You Have Any Difficulty At [employer]?: No Patient's Education Has Been Impacted by Current Illness: No   CCA Family/Childhood History Family and Relationship History: Family history Marital status: Single Does patient have children?: No  Childhood History:  Childhood History By whom was/is the patient raised?: Mother Did patient suffer any verbal/emotional/physical/sexual abuse as a child?: No Did patient suffer from severe childhood neglect?: No Has patient ever been sexually abused/assaulted/raped as an adolescent or adult?: No Was the patient ever a victim of a crime or a disaster?: No Witnessed domestic violence?: No Has patient been affected by domestic violence as an adult?: No  Child/Adolescent Assessment:     CCA Substance Use Alcohol/Drug Use: Alcohol / Drug Use Pain Medications: See MAR Prescriptions: See MAR Over the Counter: See MAR History of alcohol / drug use?: Yes Longest period of sobriety (when/how long): About 1 year ago for about 6 months Negative Consequences of Use: Personal relationships Withdrawal Symptoms: Change in blood pressure, Other (Comment) (Dizziness) Substance #1 Name of Substance 1: Alcohol 1 - Amount (size/oz): Unknown 1 - Frequency: daily 1 - Last Use / Amount: 12/25/20                       ASAM's:  Six Dimensions of Multidimensional Assessment  Dimension 1:  Acute Intoxication and/or Withdrawal Potential:   Dimension 1:  Description of individual's past and current experiences of substance use and withdrawal: Pt has an extensive hx of alcohol abuse  Dimension 2:  Biomedical Conditions and Complications:   Dimension 2:  Description of patient's biomedical conditions and  complications: None on file  Dimension 3:  Emotional, Behavioral, or Cognitive  Conditions and Complications:  Dimension 3:  Description of emotional, behavioral, or cognitive conditions and complications: Pt has a hx of depression  Dimension 4:  Readiness to Change:  Dimension 4:  Description of Readiness to Change criteria: Pt expresses a motivation to change  Dimension 5:  Relapse, Continued use, or Continued Problem Potential:     Dimension 6:  Recovery/Living Environment:     ASAM Severity Score: ASAM's Severity Rating Score: 17  ASAM Recommended Level of Treatment: ASAM Recommended Level of Treatment: Level III Residential Treatment   Substance use Disorder (SUD) Substance Use Disorder (SUD)  Checklist Symptoms of Substance Use: Continued use despite having a persistent/recurrent physical/psychological problem caused/exacerbated by use, Evidence of tolerance, Presence of craving or strong urge to use, Social, occupational, recreational activities given up  or reduced due to use, Recurrent use that results in a failure to fulfill major role obligations (work, school, home), Large amounts of time spent to obtain, use or recover from the substance(s), Substance(s) often taken in larger amounts or over longer times than was intended, Continued use despite persistent or recurrent social, interpersonal problems, caused or exacerbated by use, Persistent desire or unsuccessful efforts to cut down or control use, Repeated use in physically hazardous situations  Recommendations for Services/Supports/Treatments: Recommendations for Services/Supports/Treatments Recommendations For Services/Supports/Treatments: Detox  DSM5 Diagnoses: Patient Active Problem List   Diagnosis Date Noted   Acute delirium 07/03/2017   Alcohol abuse 07/03/2017   History of head injury 07/03/2017   Nicholas Ali R Anees Vanecek, LCAS

## 2020-12-26 NOTE — ED Notes (Signed)
Verified correct patient and correct discharge papers given. Pt alert and oriented X 4, stable for discharge. RR even and unlabored, color WNL. Discussed discharge instructions and follow-up as directed. Discharge medications discussed, when prescribed. Pt had opportunity to ask questions, and RN available to provide patient and/or family education.   

## 2020-12-26 NOTE — ED Provider Notes (Signed)
Emergency Medicine Observation Re-evaluation Note  Nicholas Ali is a 55 y.o. male, seen on rounds today.   Physical Exam  BP 122/85   Pulse 76   Temp 98.1 F (36.7 C) (Oral)   Resp 17   Ht 6\' 1"  (1.854 m)   Wt 81.6 kg   SpO2 96%   BMI 23.75 kg/m  Physical Exam General: Resting comfortably in bed Lungs: Patient in no respiratory distress Psych: Patient not combative  ED Course / MDM  EKG:EKG Interpretation  Date/Time:  Tuesday December 25 2020 23:53:23 EDT Ventricular Rate:  76 PR Interval:  198 QRS Duration: 98 QT Interval:  364 QTC Calculation: 409 R Axis:   -26 Text Interpretation: Normal sinus rhythm Septal infarct , age undetermined Abnormal ECG Confirmed by UNCONFIRMED, DOCTOR (03-26-1974), editor [id number], Tammy [phone]) on 12/26/2020 8:05:15 AM    Plan  Current plan is for detox and psych evaluation  Nicholas Ali is not under involuntary commitment.     Winfield Rast, MD 12/26/20 [phone]

## 2020-12-26 NOTE — ED Notes (Signed)
Pt provided with warm blankets retrieved by Clinical research associate. Pt informed of where nurses station, bathroom and dayroom locations are and policy as well as the need to ask for bathroom door to be unlocked. Pt provides verbal understanding.   TTS in room to speak to pt

## 2020-12-26 NOTE — BH Assessment (Signed)
Referral information for Substance abuse treatment faxed to:    [facility] [phone]- [phone]),   [facility] [phone])  [facility] [phone])  [facility] [phone])  [facility] [phone])    [facility] [phone])

## 2020-12-26 NOTE — ED Notes (Signed)
Pt CIWA assessed, PRN per protocol administered.  Pt resting in room/bed quietly.  Cont to monitor

## 2020-12-26 NOTE — BH Assessment (Signed)
Patient has been accepted to [facility] on today 12/27/20 at 10:00am Representative was Molly Maduro.   ER Staff is aware of it:  Luann, ER Secretary  Dr. Darnelle Catalan, ER MD  Connye Burkitt, Patient's Nurse

## 2021-08-30 ENCOUNTER — Emergency Department: Payer: Medicaid Other

## 2021-08-30 ENCOUNTER — Encounter: Payer: Self-pay | Admitting: Radiology

## 2021-08-30 ENCOUNTER — Emergency Department
Admission: EM | Admit: 2021-08-30 | Discharge: 2021-08-31 | Disposition: A | Discharge status: Home / Self Care | Payer: Medicaid Other | Attending: Emergency Medicine | Admitting: Emergency Medicine

## 2021-08-30 DIAGNOSIS — R200 Anesthesia of skin: Secondary | ICD-10-CM | POA: Insufficient documentation

## 2021-08-30 DIAGNOSIS — M6281 Muscle weakness (generalized): Secondary | ICD-10-CM | POA: Insufficient documentation

## 2021-08-30 IMAGING — MR MR HEAD W/O CM
12 series · 48 of 48 positions shown · non-contrast
Comparison: None Available.

CLINICAL DATA: Right upper extremity weakness and decreased
sensation

EXAM:
MRI HEAD WITHOUT CONTRAST
TECHNIQUE: Multiplanar, multiecho pulse sequences of the brain and surrounding
structures were obtained without intravenous contrast.

[Series 5: ax dwi_tracew · axial · 3.0mm · 0.65mm/px · z∈[-163,-17]mm · 3 of 48 slices shown]
[im 1/48]
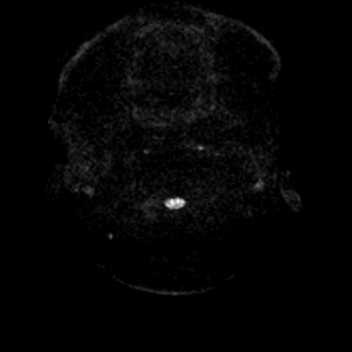
[im 24/48]
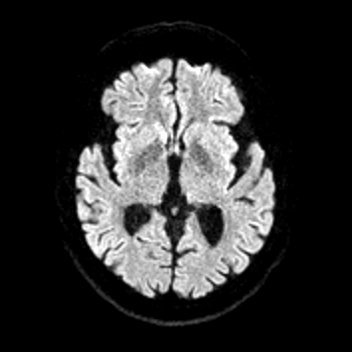
[im 48/48]
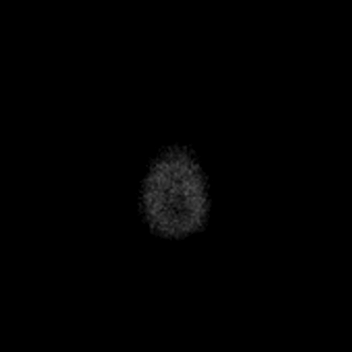

[Series 6: ax dwi_adc · axial · 3.0mm · 0.65mm/px · z∈[-163,-17]mm · 3 of 48 slices shown]
[im 1/48]
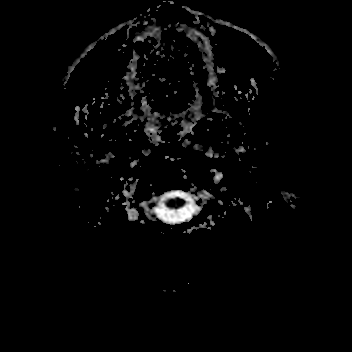
[im 24/48]
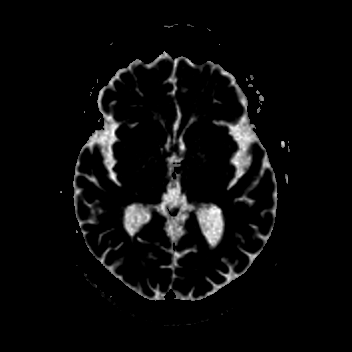
[im 48/48]
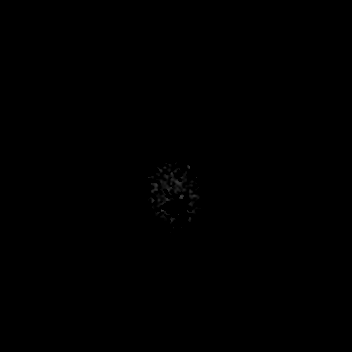

[Series 7: cor dwi_tracew · coronal · 5.0mm · 0.60mm/px · 2 of 36 slices shown]
[im 1/36]
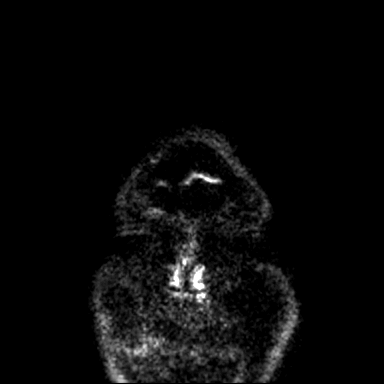
[im 36/36]
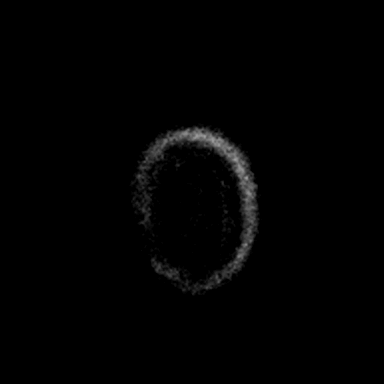

[Series 8: cor dwi_adc · coronal · 5.0mm · 0.60mm/px · 2 of 36 slices shown]
[im 1/36]
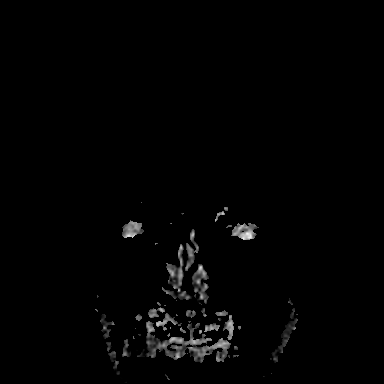
[im 36/36]
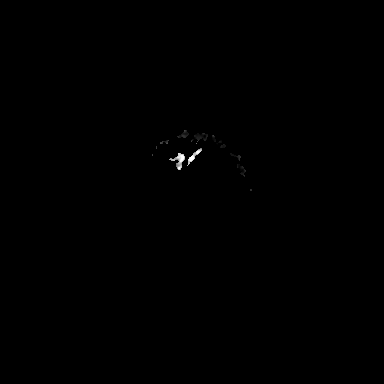

[Series 9: T1 · sagittal · 5.0mm · 0.62mm/px · 2 of 22 slices shown (1 of 2)]
[im 1/22]
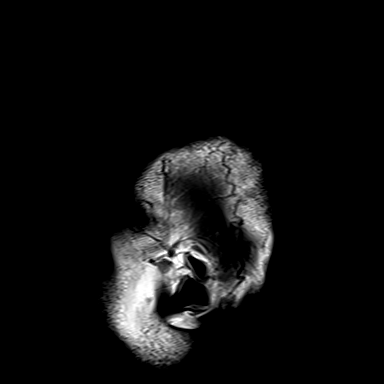
[im 22/22]
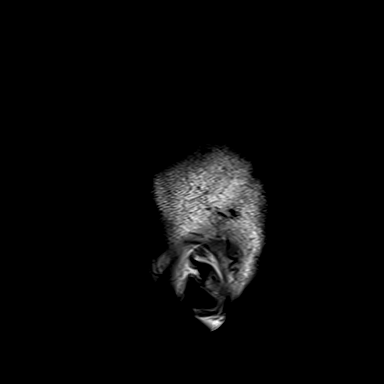

[Series 10: T2 · axial · 5.0mm · 0.53mm/px · z∈[-153,-23]mm · 2 of 24 slices shown (1 of 2)]
[im 1/24]
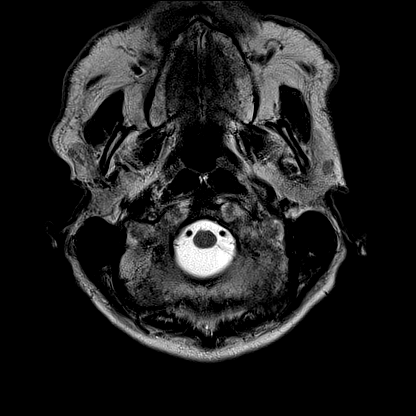
[im 24/24]
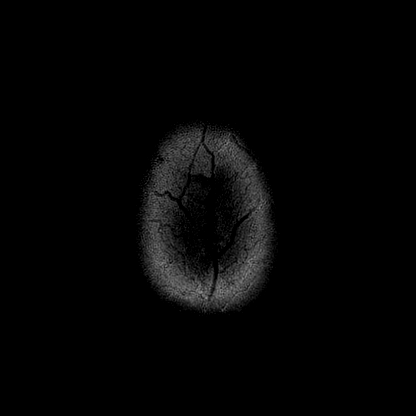

[Series 11: ax swi_mag · axial · 2.0mm · 0.90mm/px · z∈[-164,-15]mm · 6 of 80 slices shown]
[im 1/80]
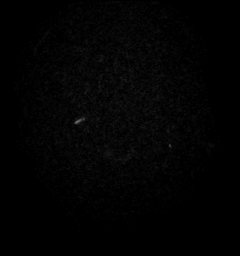
[im 16/80]
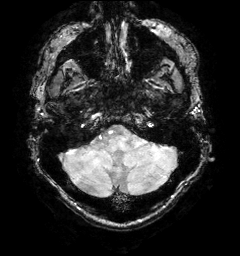
[im 32/80]
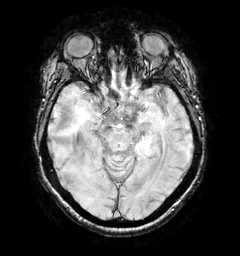
[im 48/80]
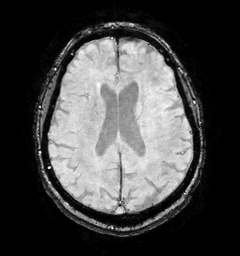
[im 64/80]
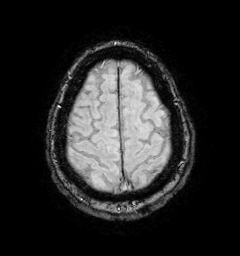
[im 80/80]
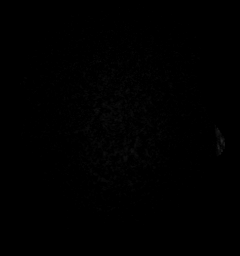

[Series 12: ax swi_pha · axial · 2.0mm · 0.90mm/px · z∈[-164,-15]mm · 6 of 80 slices shown]
[im 1/80]
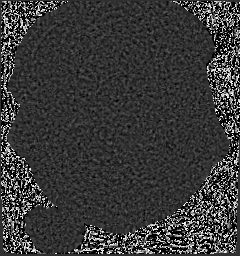
[im 16/80]
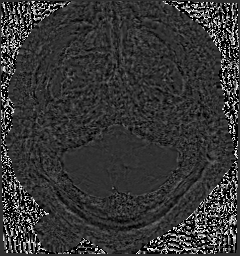
[im 32/80]
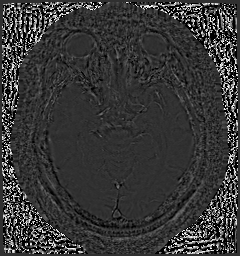
[im 48/80]
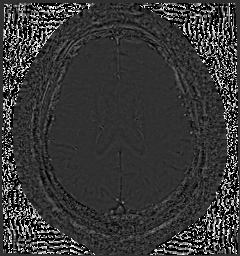
[im 64/80]
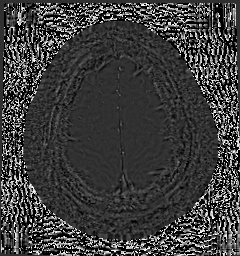
[im 80/80]
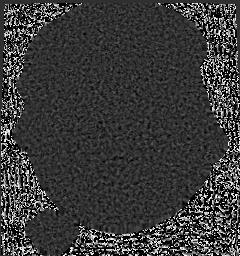

[Series 13: ax swi_swi · axial · 2.0mm · 0.90mm/px · z∈[-164,-15]mm · 6 of 80 slices shown]
[im 1/80]
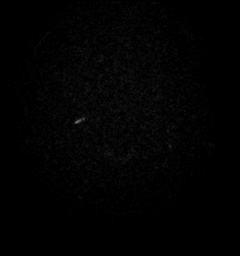
[im 16/80]
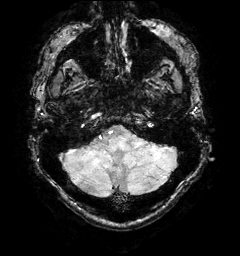
[im 32/80]
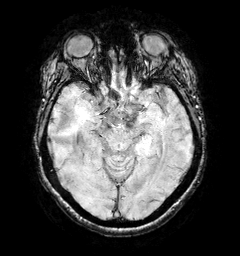
[im 48/80]
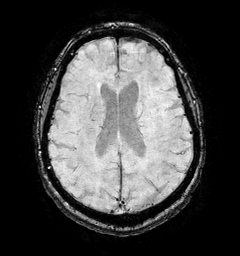
[im 64/80]
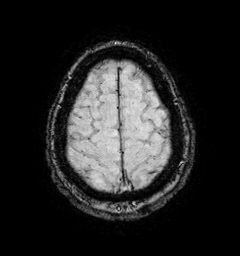
[im 80/80]
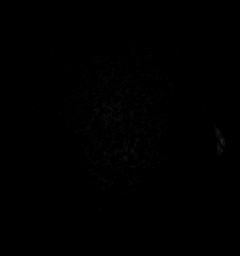

[Series 15: FLAIR · axial · 3.0mm · 0.53mm/px · z∈[-154,-22]mm · 3 of 48 slices shown]
[im 1/48]
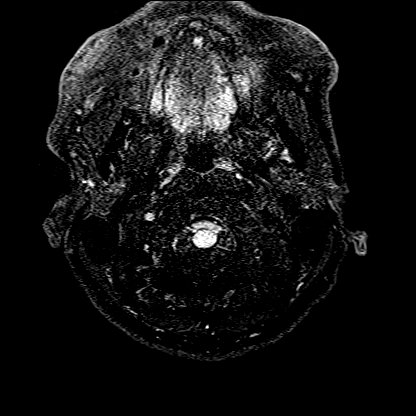
[im 24/48]
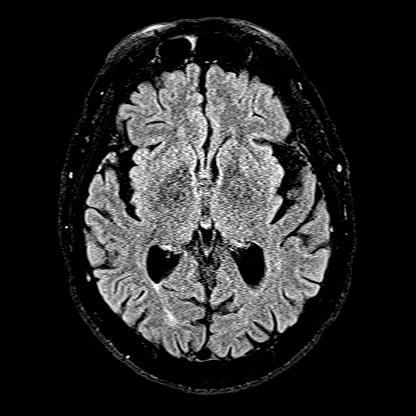
[im 48/48]
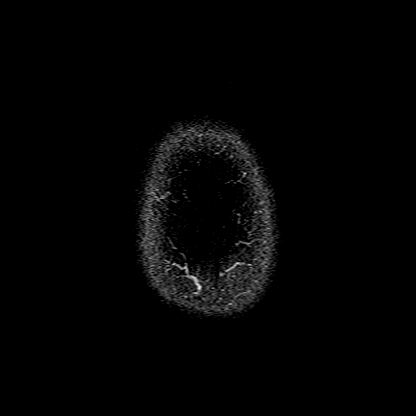

[Series 16: T1 · axial · 1.0mm · 0.98mm/px · z∈[-168,-19]mm · 11 of 160 slices shown (2 of 2)]
[im 1/160]
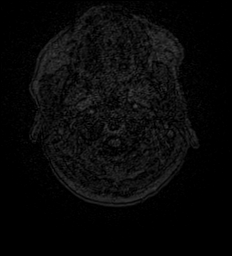
[im 16/160]
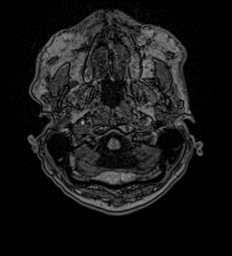
[im 32/160]
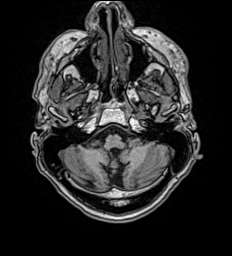
[im 48/160]
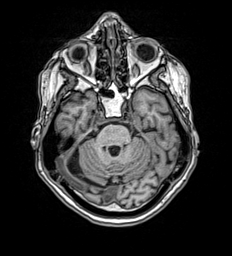
[im 64/160]
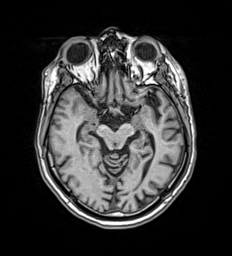
[im 80/160]
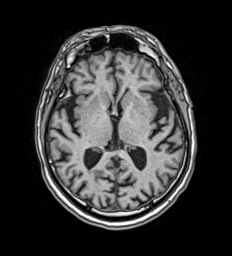
[im 96/160]
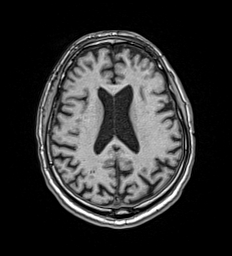
[im 112/160]
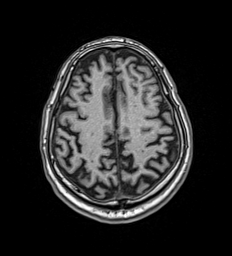
[im 128/160]
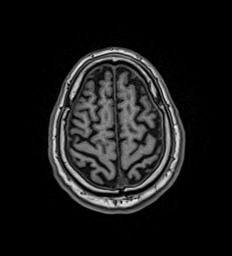
[im 144/160]
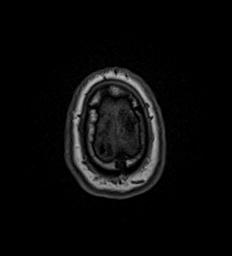
[im 160/160]
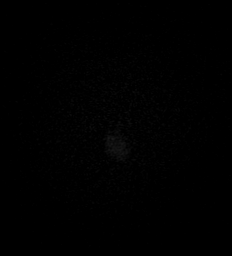

[Series 17: T2 · coronal · 5.0mm · 0.45mm/px · 2 of 30 slices shown (2 of 2)]
[im 1/30]
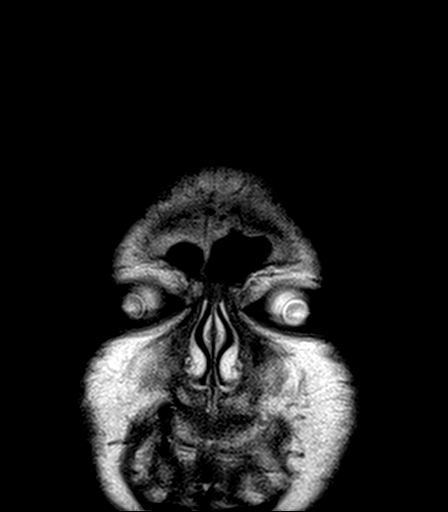
[im 30/30]
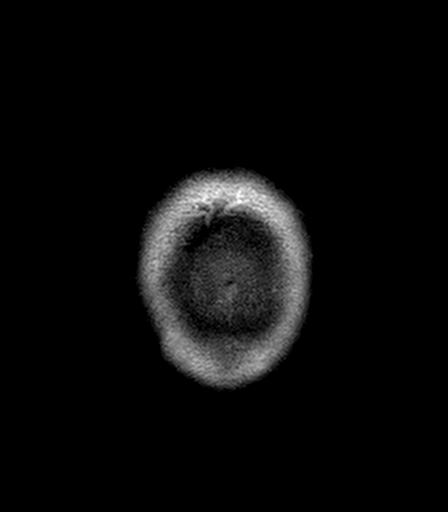

[48 of 48 positions shown; findings below may reference images not displayed]

FINDINGS: Brain: No acute infarct, mass effect or extra-axial collection. No
acute or chronic hemorrhage. Normal white matter signal, parenchymal
volume and CSF spaces. The midline structures are normal.

Vascular: Major flow voids are preserved.

Skull and upper cervical spine: Normal calvarium and skull base.
Visualized upper cervical spine and soft tissues are normal.

Sinuses/Orbits:No paranasal sinus fluid levels or advanced mucosal
thickening. No mastoid or middle ear effusion. Normal orbits.
IMPRESSION: Normal brain MRI.

## 2021-08-30 IMAGING — CT CT HEAD W/O CM
4 series · 17 of 47 positions shown, 19 images · non-contrast
Comparison: [DATE]

CLINICAL DATA: Decreased sensation from right elbow down to right
hand



[Series 2: head wo · axial · 0.41mm/px · z∈[-130,-15]mm · 7 of 31 slices shown, 9 images]
[im 4/31  brain]
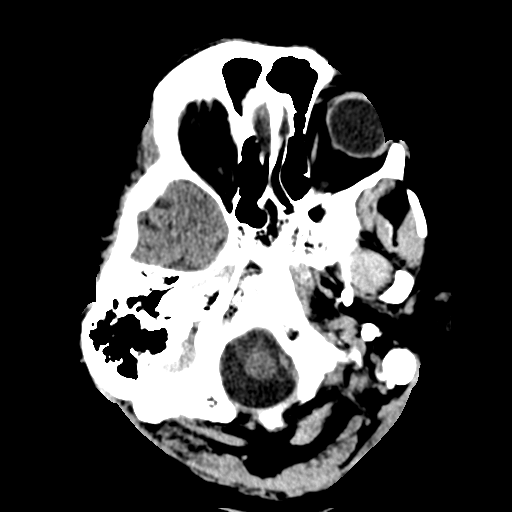
[im 4/31  bone]
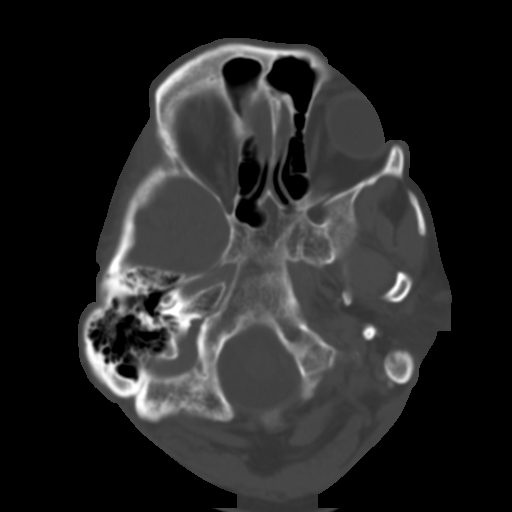
[im 8/31  brain]
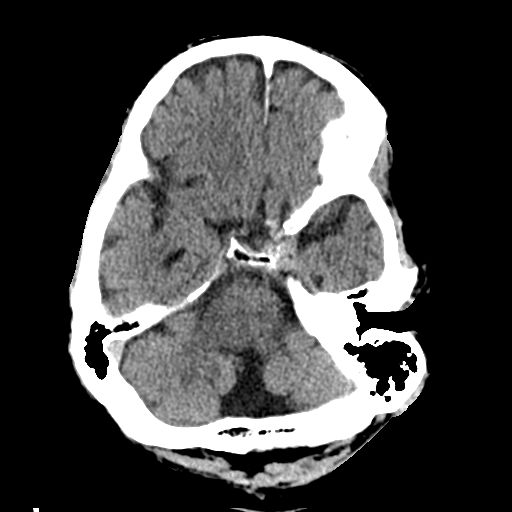
[im 12/31  brain]
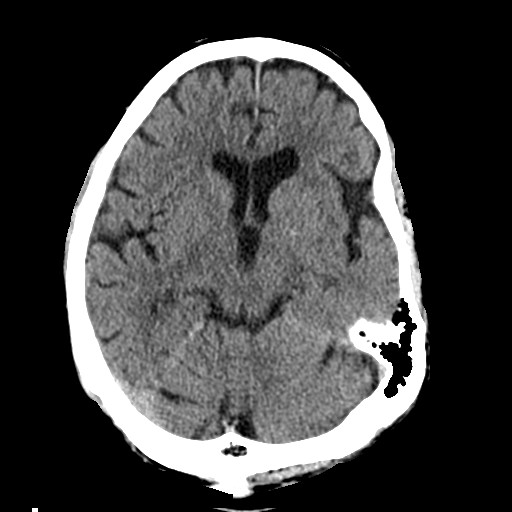
[im 16/31  brain]
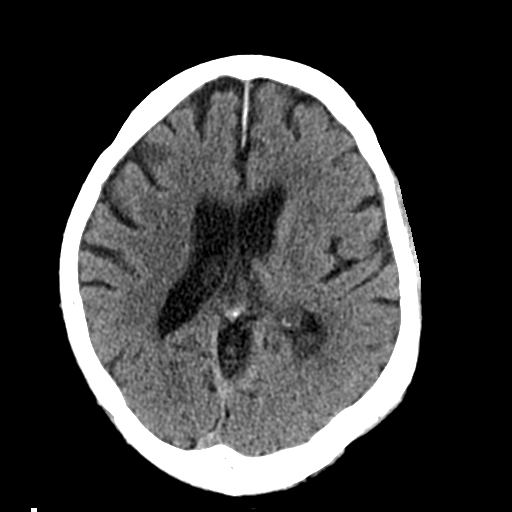
[im 19/31  brain]
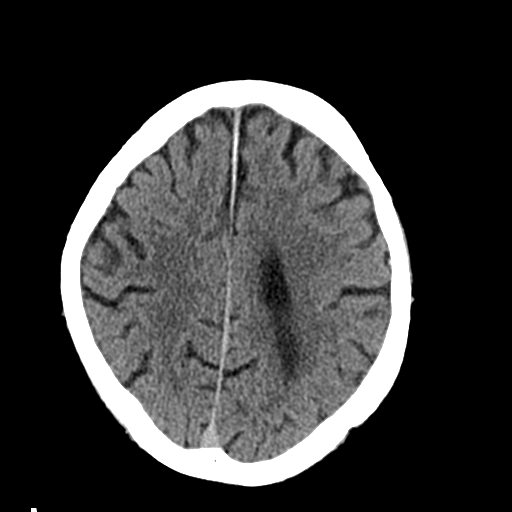
[im 19/31  bone]
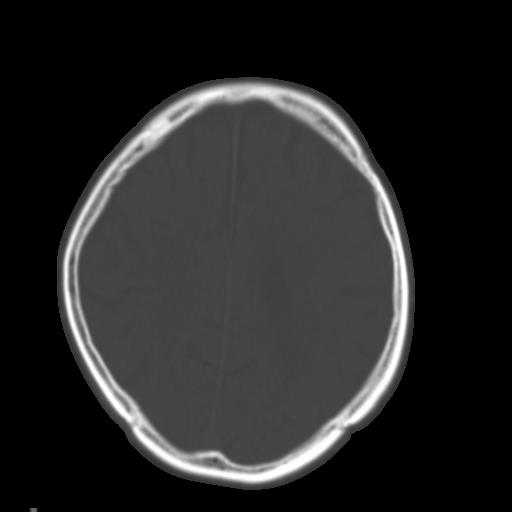
[im 23/31  brain]
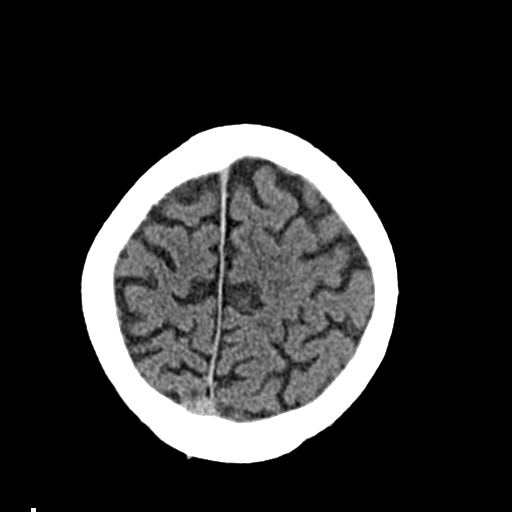
[im 27/31  brain]
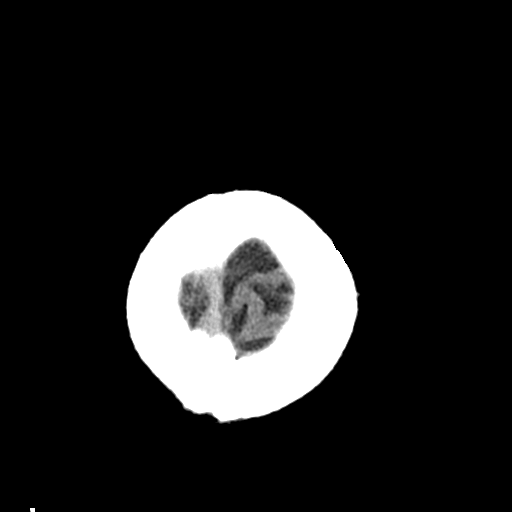

[Series 3: head bone · axial · 0.41mm/px · z∈[-131,-77]mm · 4 of 78 slices shown]
[im 8/78  bone]
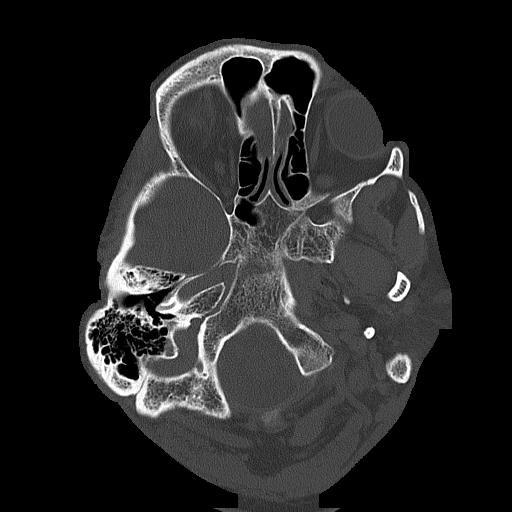
[im 16/78  bone]
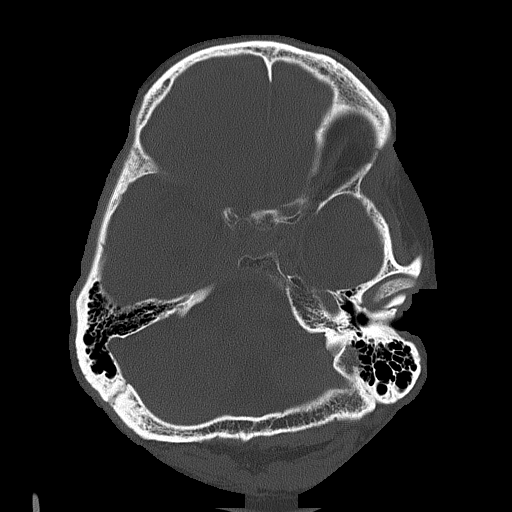
[im 24/78  bone]
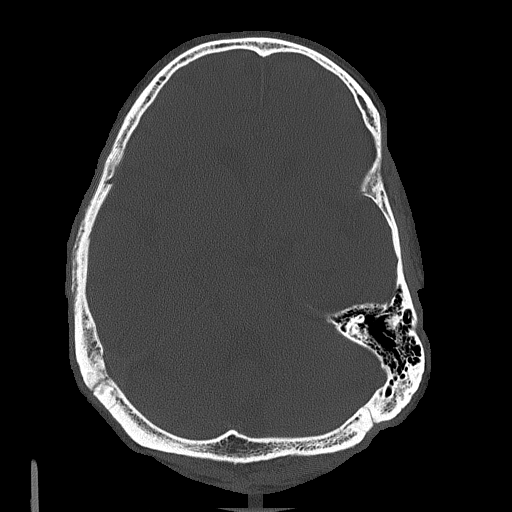
[im 35/78  bone]
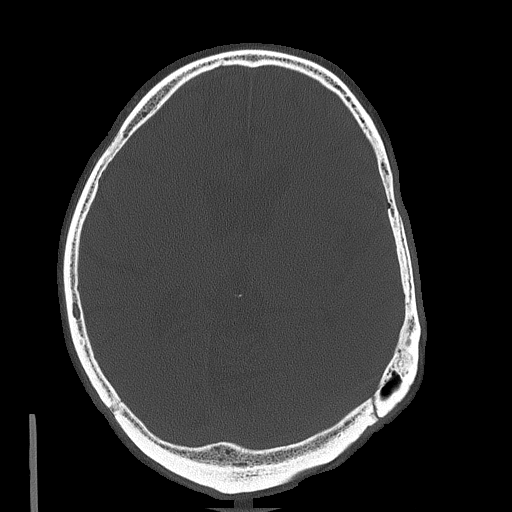

[Series 4: coronal soft tissue · coronal · 0.34mm/px · 3 of 69 slices shown]
[im 23/69  brain]
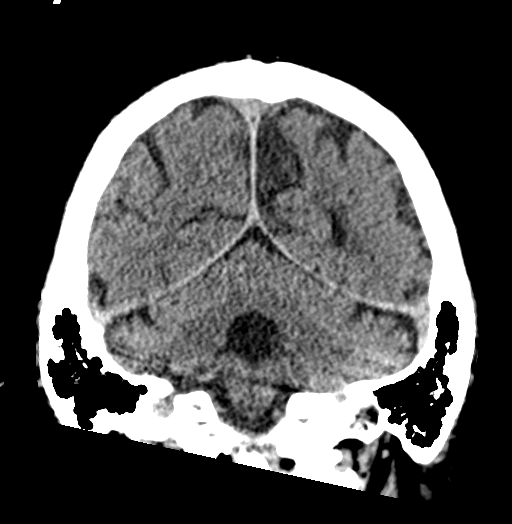
[im 31/69  brain]
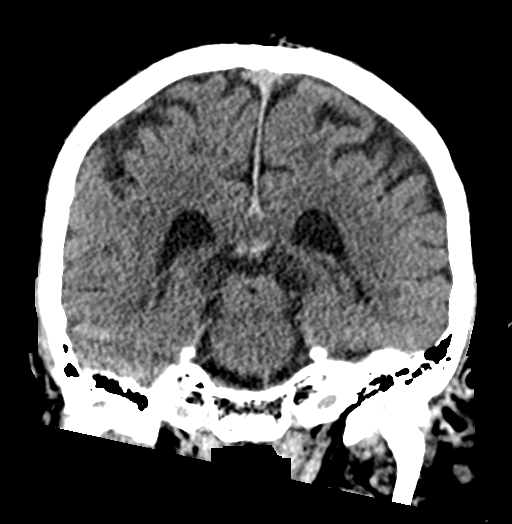
[im 38/69  brain]
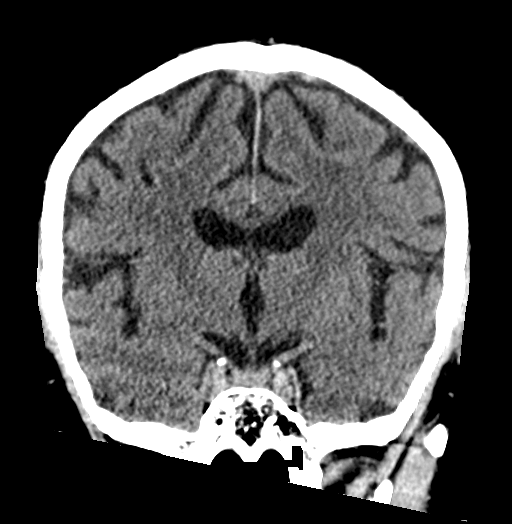

[Series 5: sagittal soft tissue · sagittal · 0.34mm/px · 3 of 57 slices shown]
[im 21/57  brain]
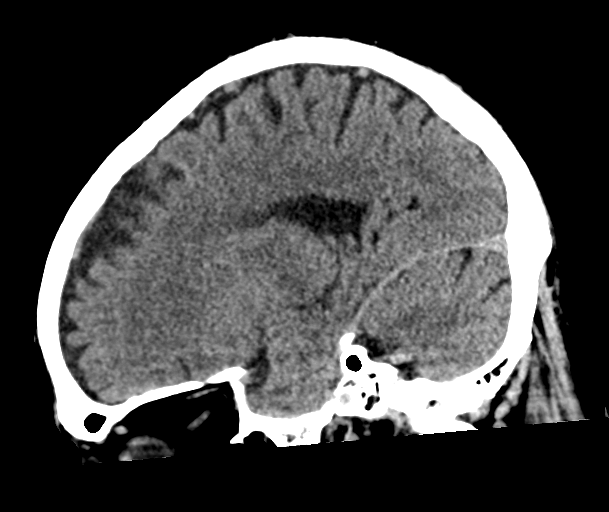
[im 29/57  brain]
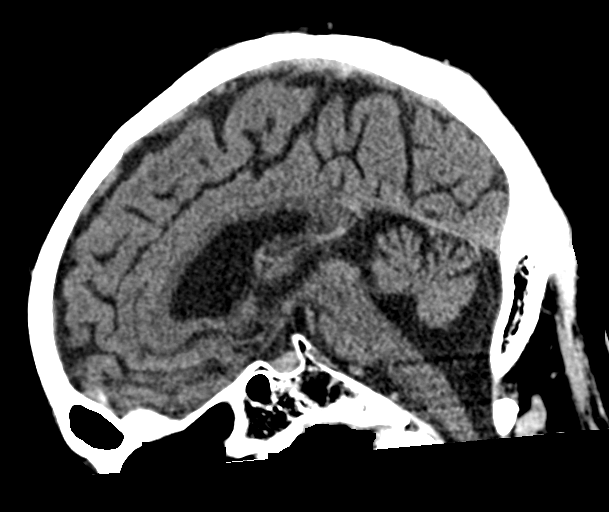
[im 36/57  brain]
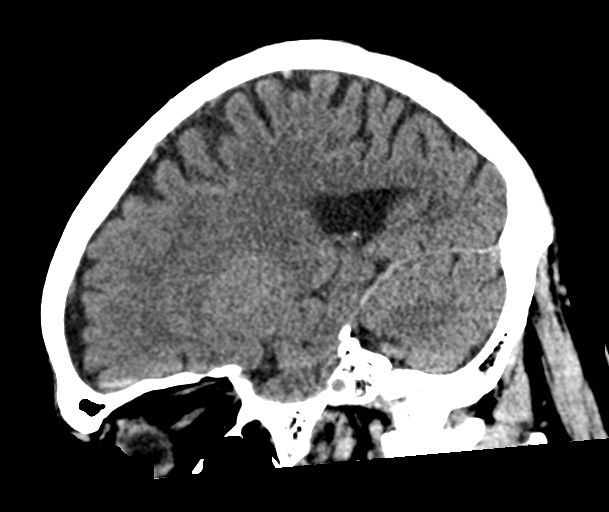

[17 of 47 positions shown; findings below may reference images not displayed]

FINDINGS: Brain: No evidence of acute infarction, hemorrhage, cerebral edema,
mass, mass effect, or midline shift. No hydrocephalus or extra-axial
fluid collection. Redemonstrated mega cisterna magna.

Vascular: No hyperdense vessel.

Skull: Normal. Negative for fracture or focal lesion.

Sinuses/Orbits: No acute finding.

Other: The mastoid air cells are well aerated.
IMPRESSION: No acute intracranial process.

## 2021-08-30 NOTE — ED Triage Notes (Signed)
Pt reports having decreased sensation from his right elbow down to his right hand. Pt walked back to triage without assistance. Pt has Hx of numbness in his right leg but never in his right arm. Pt states that his last known well was at 1600 tonight. Denies any injury.

## 2021-08-30 NOTE — ED Provider Notes (Signed)
[facility] Provider Note    Event Date/Time   First MD Initiated Contact with Patient 08/30/21 2302     (approximate)  History   Chief Complaint: Numbness  HPI  Nicholas Ali is a 56 y.o. male with a past medical history of alcohol use, presents to the emergency department for right arm weakness/numbness.  According to the patient since around 4:00 today he has noted weakness and numbness in the right upper extremity.  Patient states at times he feels like the right lower extremity is weak and numb as well but states that has been an ongoing issue.  Patient denies any headache denies any difficulty speaking or thinking.  Patient does not believe he has had a stroke previously.  Physical Exam   Triage Vital Signs: ED Triage Vitals [08/30/21 1935]  Enc Vitals Group     BP 128/85     Pulse Rate 81     Resp 17     Temp 98.4 F (36.9 C)     Temp Source Oral     SpO2 98 %     Weight      Height      Head Circumference      Peak Flow      Pain Score 0     Pain Loc      Pain Edu?      Excl. in [place]?     Most recent vital signs: Vitals:   08/30/21 1935  BP: 128/85  Pulse: 81  Resp: 17  Temp: 98.4 F (36.9 C)  SpO2: 98%    General: Awake, no distress.  Patient is somewhat difficult to understand but answers questions appropriately.  Not necessarily slurred speech more mumbled voice. CV:  Good peripheral perfusion.  Regular rate and rhythm  Resp:  Normal effort.  Equal breath sounds bilaterally.  Abd:  No distention.  Soft, nontender.  No rebound or guarding. Other:  Patient has diminished grip strength in the right upper extremity unable to supinate right upper extremity or fully extend right upper extremity.  5/5 motor in right lower extremity, equal sensation bilaterally in the lower extremities.  States decree sensation in the right upper extremity.  No obvious cranial nerve deficits although effort of exam makes this difficult to exclude.   ED  Results / Procedures / Treatments    RADIOLOGY  I have interpreted CT images no large bleed seen on my evaluation. Radiology is read the CT is negative MRI of the brain is negative. 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   MEDICATIONS ORDERED IN ED: Medications - No data to display   IMPRESSION / MDM / [place] / ED COURSE  I reviewed the triage vital signs and the nursing notes.  Patient presents to the emergency department for right upper extremity weakness/numbness since 4 PM today.  On examination patient has very clear weakness in the right upper extremity unable to supinate or fully extend right upper extremity.  He states he thinks this is happened previously, he is not a great historian but states since 4 PM it has been worse.  Patient CT scan head is negative however strong suspicion for CVA we will proceed with MR imaging.  Differential would also include TIA, peripheral neuropathy, neuropraxia.  MRI is negative for CVA.  Patient continues to have weakness in the right upper extremity.  Patient is an extremely poor historian and is not clear if this is  intermittent or longstanding he now states it is new x2 days.  I have attempted to call the patient's niece with whom he lives to obtain some collateral information but I am unable to reach her.  Given the patient's right upper extremity weakness we will send the patient for an MRI of the cervical spine as a precaution.  MRI of the cervical spine does show severe bilateral foraminal stenosis.  I have reevaluated the patient he continues to have fairly significant right upper extremity weakness and trouble supinating.  I spoke to Dr. Cari Caraway of neurosurgery who will be by to see the patient.  I dose 10 mg of Decadron while awaiting neurosurgery evaluation.  FINAL CLINICAL IMPRESSION(S) / ED DIAGNOSES   Right upper extremity weakness    Note:  This document was prepared using Dragon  voice recognition software and may include unintentional dictation errors.   Harvest Dark, MD 09/06/21 [phone]

## 2021-08-31 ENCOUNTER — Other Ambulatory Visit: Payer: Self-pay

## 2021-08-31 ENCOUNTER — Emergency Department: Payer: Medicaid Other

## 2021-08-31 LAB — ETHANOL: Alcohol, Ethyl (B): <10 mg/dL (ref <10)

## 2021-08-31 LAB — TROPONIN I (HIGH SENSITIVITY)
Troponin I (High Sensitivity): <2 ng/L (ref <18)
Troponin I (High Sensitivity): 3 ng/L (ref <18)

## 2021-08-31 LAB — CBC
HCT: 44.4 % (ref 39.0–52.0)
Hemoglobin: 14.6 g/dL (ref 13.0–17.0)
MCH: 28.6 pg (ref 26.0–34.0)
MCHC: 32.9 g/dL (ref 30.0–36.0)
MCV: 86.9 fL (ref 80.0–100.0)
Platelets: 175 10*3/uL (ref 150–400)
RBC: 5.11 MIL/uL (ref 4.22–5.81)
RDW: 13.3 % (ref 11.5–15.5)
WBC: 4.8 10*3/uL (ref 4.0–10.5)
nRBC: 0 % (ref 0.0–0.2)

## 2021-08-31 LAB — COMPREHENSIVE METABOLIC PANEL
ALT: 12 U/L (ref 0–44)
AST: 21 U/L (ref 15–41)
Albumin: 3.8 g/dL (ref 3.5–5.0)
Alkaline Phosphatase: 50 U/L (ref 38–126)
Anion gap: 7 (ref 5–15)
BUN: 13 mg/dL (ref 6–20)
CO2: 26 mmol/L (ref 22–32)
Calcium: 9.3 mg/dL (ref 8.9–10.3)
Chloride: 109 mmol/L (ref 98–111)
Creatinine, Ser: 1.09 mg/dL (ref 0.61–1.24)
GFR, Estimated: >60 mL/min (ref >60)
Glucose, Bld: 98 mg/dL (ref 70–99)
Potassium: 3.7 mmol/L (ref 3.5–5.1)
Sodium: 142 mmol/L (ref 135–145)
Total Bilirubin: 0.5 mg/dL (ref 0.3–1.2)
Total Protein: 7.4 g/dL (ref 6.5–8.1)

## 2021-08-31 IMAGING — MR MR CERVICAL SPINE W/O CM
5 series · 40 of 48 positions shown · non-contrast
Comparison: None Available.

CLINICAL DATA: Cervical radiculopathy. Decreased right upper
extremity sensation.

EXAM:
MRI CERVICAL SPINE WITHOUT CONTRAST
TECHNIQUE: Multiplanar, multisequence MR imaging of the cervical spine was
performed. No intravenous contrast was administered.

[Series 5: T2 · sagittal · 3.0mm · 0.62mm/px · 6 of 14 slices shown (1 of 2)]
[im 1/14]
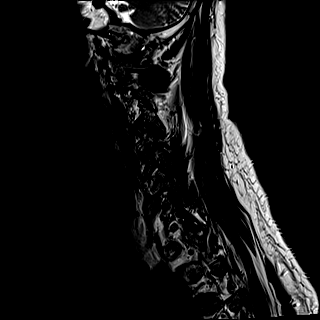
[im 3/14]
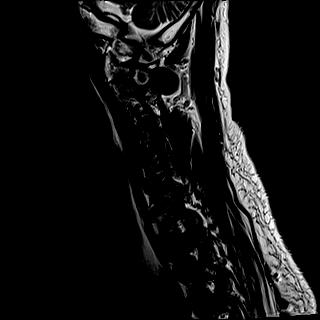
[im 6/14]
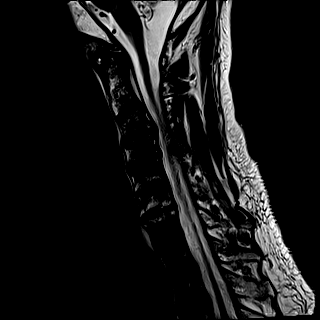
[im 8/14]
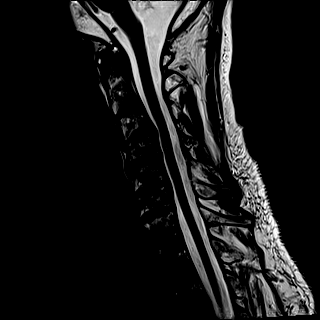
[im 11/14]
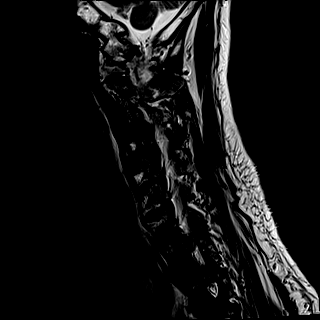
[im 14/14]
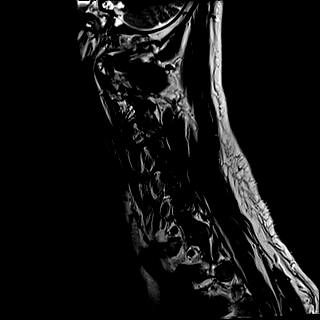

[Series 6: FLAIR · sagittal · 3.0mm · 0.78mm/px · 6 of 14 slices shown]
[im 1/14]
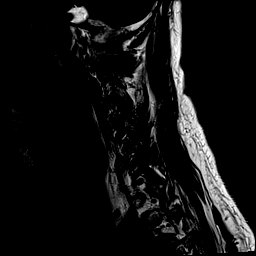
[im 3/14]
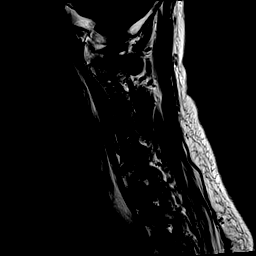
[im 6/14]
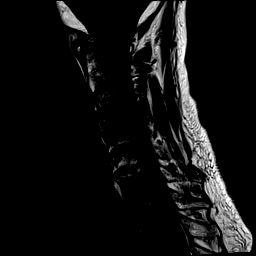
[im 8/14]
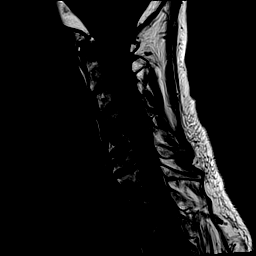
[im 11/14]
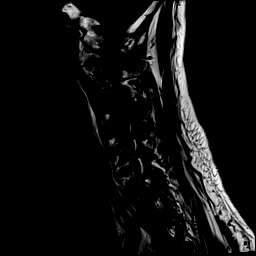
[im 14/14]
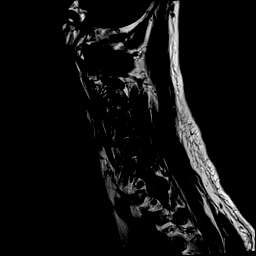

[Series 7: STIR · sagittal · 3.0mm · 0.62mm/px · 6 of 14 slices shown]
[im 1/14]
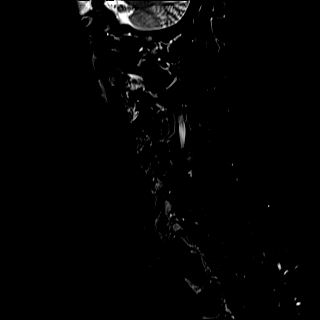
[im 3/14]
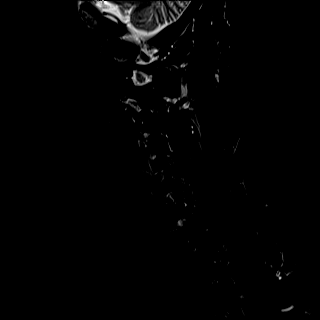
[im 6/14]
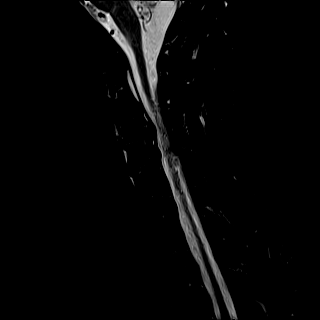
[im 8/14]
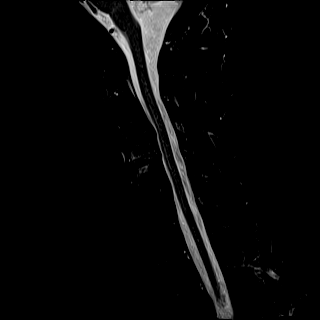
[im 11/14]
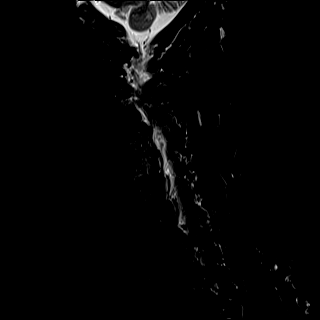
[im 14/14]
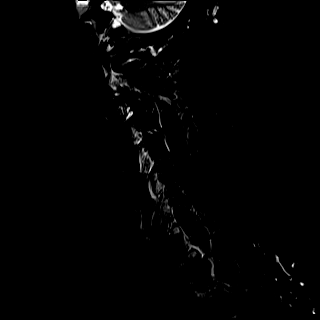

[Series 8: T2 · axial · 3.5mm · 0.70mm/px · z∈[-105,+24]mm · 14 of 34 slices shown (2 of 2)]
[im 1/34]
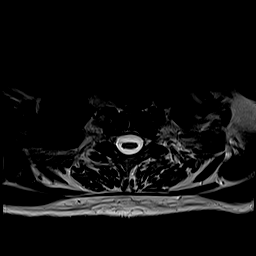
[im 3/34]
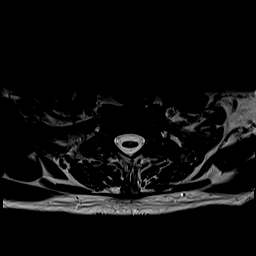
[im 5/34]
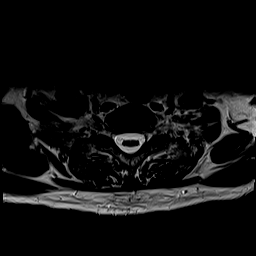
[im 8/34]
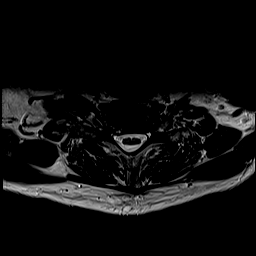
[im 10/34]
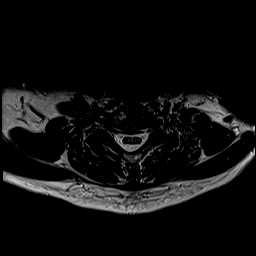
[im 12/34]
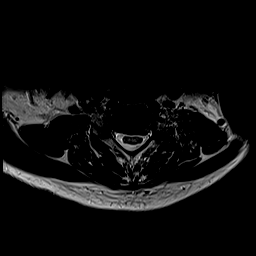
[im 15/34]
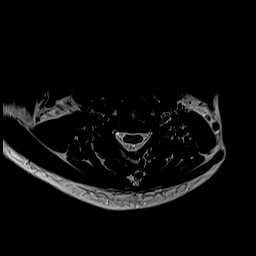
[im 17/34]
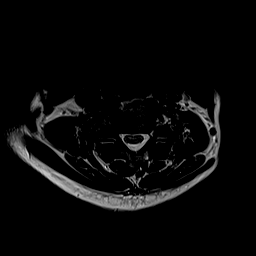
[im 19/34]
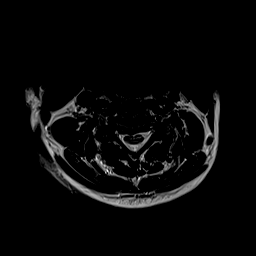
[im 22/34]
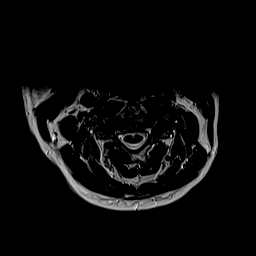
[im 24/34]
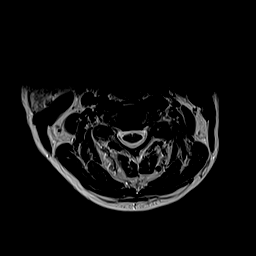
[im 26/34]
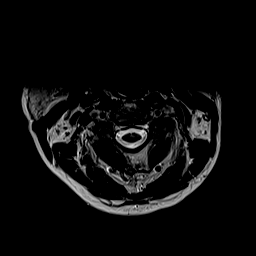
[im 29/34]
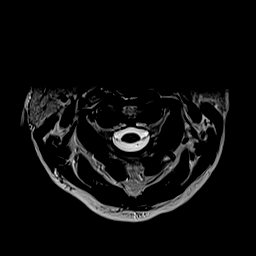
[im 34/34]
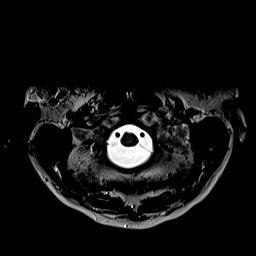

[Series 9: ax mpgr · axial · 3.5mm · 0.35mm/px · z∈[-105,+24]mm · 8 of 34 slices shown]
[im 1/34]
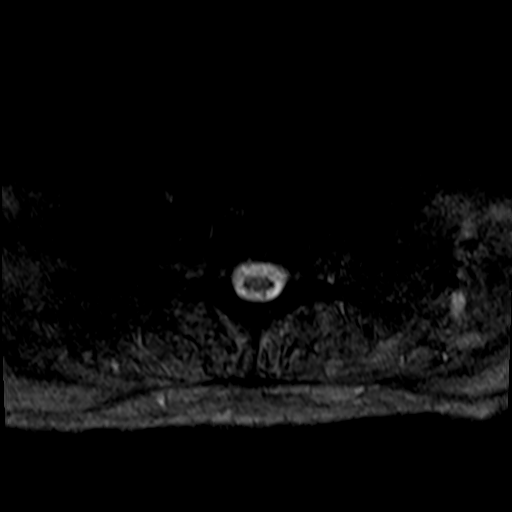
[im 5/34]
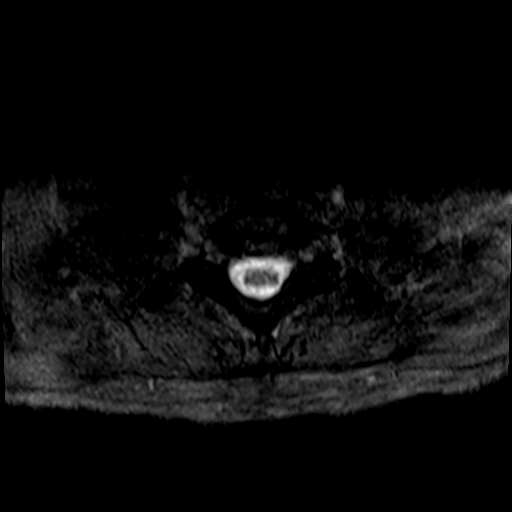
[im 10/34]
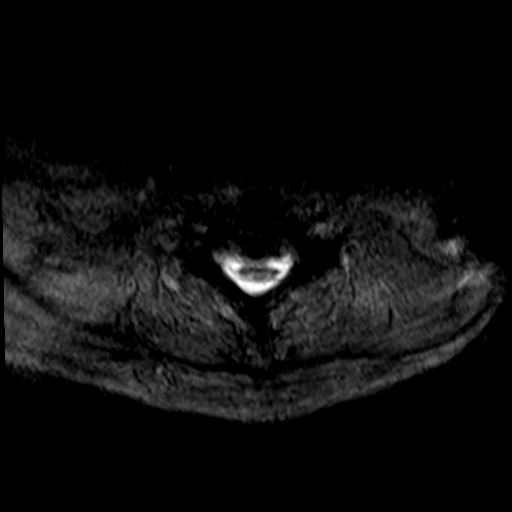
[im 15/34]
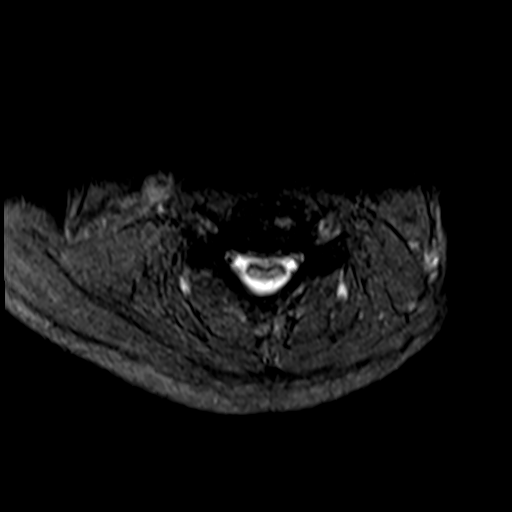
[im 19/34]
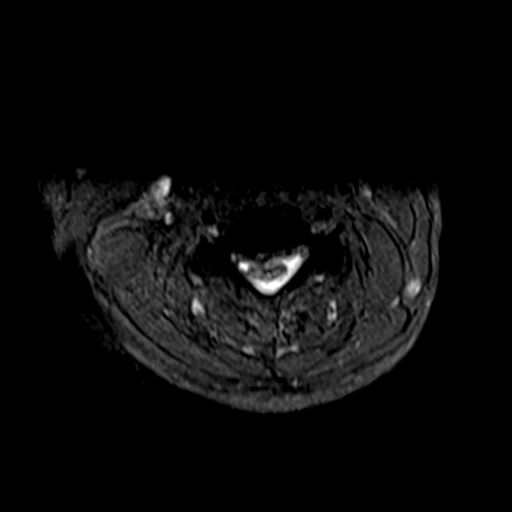
[im 24/34]
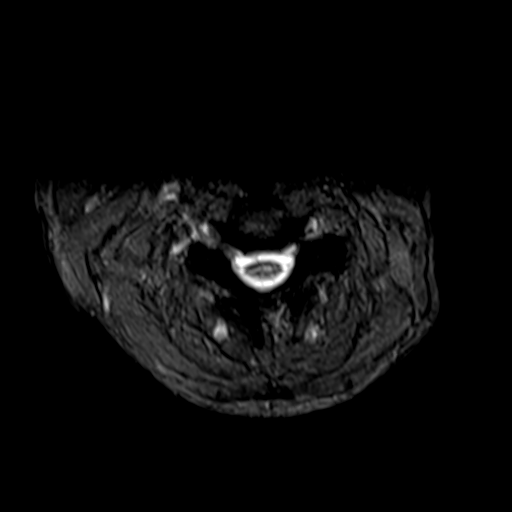
[im 29/34]
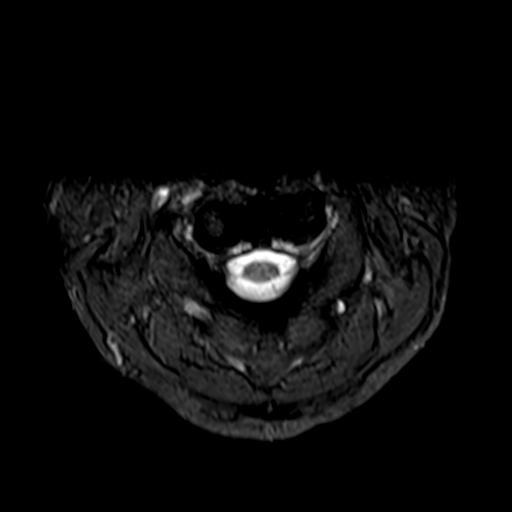
[im 34/34]
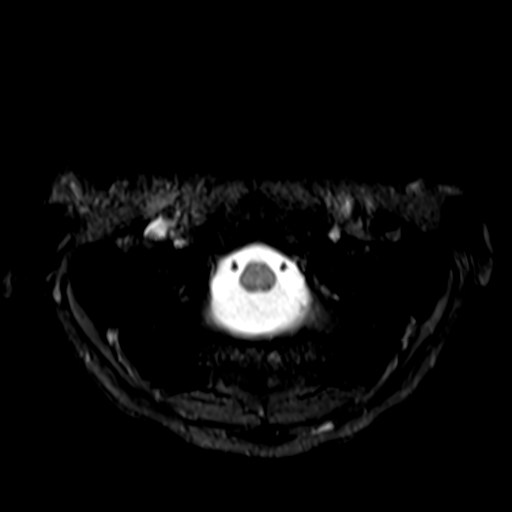

[40 of 48 positions shown; findings below may reference images not displayed]

FINDINGS: Alignment: Physiologic.

Vertebrae: Discogenic endplate edema at C4-5.  No acute abnormality.

Cord: Normal signal and morphology.

Posterior Fossa, vertebral arteries, paraspinal tissues: Negative.

Disc levels:

C1-2: Unremarkable.

C2-3: Normal disc space and facet joints. There is no spinal canal
stenosis. No neural foraminal stenosis.

C3-4: Medium-sized disc osteophyte complex. There is no spinal canal
stenosis. Severe bilateral neural foraminal stenosis.

C4-5: Small disc bulge with right-greater-than-left uncovertebral
hypertrophy. There is no spinal canal stenosis. Moderate right
neural foraminal stenosis.

C5-6: Small disc bulge with endplate spurring. There is no spinal
canal stenosis. Right neural foraminal stenosis.

C6-7: Right asymmetric disc bulge and uncovertebral hypertrophy.
There is no spinal canal stenosis. Severe right neural foraminal
stenosis.

C7-T1: Normal disc space and facet joints. There is no spinal canal
stenosis. No neural foraminal stenosis.
IMPRESSION: 1. Multilevel degenerative disc disease with severe bilateral C3-4
and right C6-7 neural foraminal stenosis.
2. Moderate right C4-5 neural foraminal stenosis.
3. No spinal canal stenosis.

## 2021-08-31 MED ORDER — DEXAMETHASONE SODIUM PHOSPHATE 10 MG/ML IJ SOLN
10.0000 mg | Freq: Once | INTRAMUSCULAR | Status: AC
  Administered 2021-08-31: 10 mg via INTRAVENOUS
  Filled 2021-08-31: qty 1

## 2021-08-31 NOTE — ED Notes (Signed)
Reviewed discharge instructions, follow-up care, and prescriptions with patient. Patient verbalized understanding of all information reviewed. Patient stable, with no distress noted at this time.    

## 2021-08-31 NOTE — Consult Note (Signed)
Referring Physician:  No referring provider defined for this encounter.  Primary Physician:  Pcp, No  Chief Complaint:  RUE weakness  History of Present Illness: 08/31/2021 Nicholas Ali is a 56 y.o. male who presents with the chief complaint of weakness of R arm.  He denies pain down his arm.  He has some numbness that started yesterday afternoon below his elbow only.    He has had trouble using his R arm.  He denies trauma.    Review of Systems:  A 10 point review of systems is negative, except for the pertinent positives and negatives detailed in the HPI.  Past Medical History: Past Medical History:  Diagnosis Date   Alcohol abuse    Epistaxis     Past Surgical History: No past surgical history on file.  Allergies: Allergies as of 08/30/2021   (No Known Allergies)    Medications: No current facility-administered medications for this encounter.  Current Outpatient Medications:    B Complex-C-Folic Acid (B COMPLEX-VITAMIN C-FOLIC ACID) 1 MG tablet, Take 1 tablet by mouth daily with breakfast., Disp: 90 tablet, Rfl: 3   ergocalciferol (VITAMIN D2) 1.25 MG (50000 UT) capsule, Take 50,000 Units by mouth once a week., Disp: , Rfl:    thiamine 100 MG tablet, Take 1 tablet (100 mg total) by mouth daily., Disp: 90 tablet, Rfl: 3   traZODone (DESYREL) 50 MG tablet, Take 50 mg by mouth at bedtime as needed., Disp: , Rfl:    Social History: Social History   Tobacco Use   Smoking status: Never   Smokeless tobacco: Former  Substance Use Topics   Alcohol use: Yes   Drug use: No    Family Medical History: No family history on file.  Physical Examination: Vitals:   08/31/21 0600 08/31/21 0630  BP: (!) 134/104 (!) 133/93  Pulse: 65 65  Resp: 11 14  Temp:    SpO2: 99% 97%     General: Patient is well developed, well nourished, calm, collected, and in no apparent distress.  Psychiatric: Patient is non-anxious.  Head:  Pupils equal, round, and reactive to  light.  ENT:  Oral mucosa appears well hydrated.  Neck:   Supple.  Full range of motion.  Respiratory: Patient is breathing without any difficulty.  Extremities: No edema.  Vascular: Palpable pulses in dorsal pedal vessels.  Skin:   On exposed skin, there are no abnormal skin lesions.  NEUROLOGICAL:  General: In no acute distress.   Awake, alert, oriented to person, place, and time.  Pupils equal round and reactive to light.  Facial tone is symmetric.  Tongue protrusion is midline.     Strength: Side Biceps Triceps Deltoid Interossei Grip Wrist Ext. Wrist Flex.  R 5 5 5 2 2 1 2   L 5 5 5 5 5 5 5    Side Iliopsoas Quads Hamstring PF DF EHL  R 5 5 5 5 5 5   L 5 5 5 5 5 5     Lumbricals and finger extension are 1/5 on R.    Bilateral upper and lower extremity sensation is intact to light touch. Reflexes are 1+ and symmetric at the biceps, triceps, brachioradialis, patella and achilles. Hoffman's is absent.  Clonus is not present.  Toes are down-going.    Gait is normal.   Imaging: MRI C spine 08/31/21 IMPRESSION: 1. Multilevel degenerative disc disease with severe bilateral C3-4 and right C6-7 neural foraminal stenosis. 2. Moderate right C4-5 neural foraminal stenosis. 3. No spinal canal stenosis.  Electronically Signed   By: Ulyses Jarred M.D.   On: 08/31/2021 03:56  I have personally reviewed the images and agree with the above interpretation.  Labs:    Latest Ref Rng & Units 08/31/2021   12:44 AM 12/25/2020   11:30 PM 11/29/2020    2:42 AM  CBC  WBC 4.0 - 10.5 K/uL 4.8   4.4   4.4    Hemoglobin 13.0 - 17.0 g/dL 14.6   14.0   14.3    Hematocrit 39.0 - 52.0 % 44.4   39.6   40.8    Platelets 150 - 400 K/uL 175   161   163         Assessment and Plan: Mr. Greig is a pleasant 56 y.o. male with RUE weakness of 2 days duration.  Though many of the affected muscles are C7 innervated, the R triceps is not involved.  Additionally, his lumbricals are involved,  which are C8 and T1 mediated.  Thus, I think his lesion is more likely peripheral than due to the C7 compression at C6-7.  I think he needs an EMG, which we will help arrange as outpatient.    Brystal Kildow K. Izora Ribas MD, [place] [facility]

## 2021-08-31 NOTE — Discharge Instructions (Signed)
He is follow-up closely with neurosurgery, Dr. Marcell Barlow.  Return to the ER right away if you have increasing weakness, difficulty speaking, noticed weakness in new areas of the body such as the face or legs, develop a fever, headaches, neck pain or other concerns arise.

## 2021-08-31 NOTE — ED Provider Notes (Signed)
Vitals:   08/31/21 0600 08/31/21 0630  BP: (!) 134/104 (!) 133/93  Pulse: 65 65  Resp: 11 14  Temp:    SpO2: 99% 97%   Neurosurgery (Dr. Cari Caraway) has completed his consult.   Patient is resting comfortably at this time, fully alert, and pleasant.  He understands current plan of care which is to discharge and to follow-up with Dr. Cari Caraway at neurosurgery clinic.  Dr. Cari Caraway is also having his clinic reach out to the patient this coming week.  Discussed careful return precautions with the patient.  Patient agreeable with plan for discharge, return precautions, and understands plan to follow-up with neurosurgery clinic.   Delman Kitten, MD 08/31/21 [phone]

## 2021-09-07 ENCOUNTER — Emergency Department
Admission: EM | Admit: 2021-09-07 | Discharge: 2021-09-11 | Disposition: A | Discharge status: Home / Self Care | Payer: No Typology Code available for payment source | Attending: Emergency Medicine | Admitting: Emergency Medicine

## 2021-09-07 ENCOUNTER — Other Ambulatory Visit: Payer: Self-pay

## 2021-09-07 DIAGNOSIS — F10129 Alcohol abuse with intoxication, unspecified: Secondary | ICD-10-CM | POA: Diagnosis not present

## 2021-09-07 DIAGNOSIS — Z046 Encounter for general psychiatric examination, requested by authority: Secondary | ICD-10-CM | POA: Diagnosis present

## 2021-09-07 DIAGNOSIS — Y908 Blood alcohol level of 240 mg/100 ml or more: Secondary | ICD-10-CM | POA: Diagnosis not present

## 2021-09-07 DIAGNOSIS — F1010 Alcohol abuse, uncomplicated: Secondary | ICD-10-CM | POA: Insufficient documentation

## 2021-09-07 LAB — BASIC METABOLIC PANEL
Anion gap: 9 (ref 5–15)
BUN: 11 mg/dL (ref 6–20)
CO2: 24 mmol/L (ref 22–32)
Calcium: 8.5 mg/dL — ABNORMAL LOW (ref 8.9–10.3)
Chloride: 107 mmol/L (ref 98–111)
Creatinine, Ser: 1.22 mg/dL (ref 0.61–1.24)
GFR, Estimated: >60 mL/min (ref >60)
Glucose, Bld: 81 mg/dL (ref 70–99)
Potassium: 3.6 mmol/L (ref 3.5–5.1)
Sodium: 140 mmol/L (ref 135–145)

## 2021-09-07 LAB — CBC WITH DIFFERENTIAL/PLATELET
Abs Immature Granulocytes: 0.03 10*3/uL (ref 0.00–0.07)
Basophils Absolute: 0 10*3/uL (ref 0.0–0.1)
Basophils Relative: 0 %
Eosinophils Absolute: 0.1 10*3/uL (ref 0.0–0.5)
Eosinophils Relative: 2 %
HCT: 44 % (ref 39.0–52.0)
Hemoglobin: 14.3 g/dL (ref 13.0–17.0)
Immature Granulocytes: 1 %
Lymphocytes Relative: 34 %
Lymphs Abs: 1.9 10*3/uL (ref 0.7–4.0)
MCH: 28.5 pg (ref 26.0–34.0)
MCHC: 32.5 g/dL (ref 30.0–36.0)
MCV: 87.6 fL (ref 80.0–100.0)
Monocytes Absolute: 0.5 10*3/uL (ref 0.1–1.0)
Monocytes Relative: 10 %
Neutro Abs: 3 10*3/uL (ref 1.7–7.7)
Neutrophils Relative %: 53 %
Platelets: 181 10*3/uL (ref 150–400)
RBC: 5.02 MIL/uL (ref 4.22–5.81)
RDW: 13 % (ref 11.5–15.5)
WBC: 5.7 10*3/uL (ref 4.0–10.5)
nRBC: 0 % (ref 0.0–0.2)

## 2021-09-07 LAB — URINE DRUG SCREEN, QUALITATIVE (ARMC ONLY)
Amphetamines, Ur Screen: NOT DETECTED
Barbiturates, Ur Screen: NOT DETECTED
Benzodiazepine, Ur Scrn: NOT DETECTED
Cannabinoid 50 Ng, Ur ~~LOC~~: NOT DETECTED
Cocaine Metabolite,Ur ~~LOC~~: NOT DETECTED
MDMA (Ecstasy)Ur Screen: NOT DETECTED
Methadone Scn, Ur: NOT DETECTED
Opiate, Ur Screen: NOT DETECTED
Phencyclidine (PCP) Ur S: NOT DETECTED
Tricyclic, Ur Screen: NOT DETECTED

## 2021-09-07 LAB — ETHANOL: Alcohol, Ethyl (B): 212 mg/dL — ABNORMAL HIGH (ref <10)

## 2021-09-07 MED ORDER — LORAZEPAM 2 MG/ML IJ SOLN: 0.0000 mg | Freq: Two times a day (BID) | INTRAMUSCULAR | Status: DC

## 2021-09-07 MED ORDER — THIAMINE HCL 100 MG/ML IJ SOLN: 100.0000 mg | Freq: Every day | INTRAMUSCULAR | Status: DC

## 2021-09-07 MED ORDER — LORAZEPAM 2 MG PO TABS: 0.0000 mg | ORAL_TABLET | Freq: Two times a day (BID) | ORAL | Status: DC

## 2021-09-07 MED ORDER — LORAZEPAM 2 MG/ML IJ SOLN: 0.0000 mg | Freq: Four times a day (QID) | INTRAMUSCULAR | Status: AC

## 2021-09-07 MED ORDER — THIAMINE HCL 100 MG PO TABS
100.0000 mg | ORAL_TABLET | Freq: Every day | ORAL | Status: DC
  Administered 2021-09-09 – 2021-09-11 (×3): 100 mg via ORAL
  Filled 2021-09-07 (×3): qty 1

## 2021-09-07 MED ORDER — LORAZEPAM 2 MG PO TABS: 0.0000 mg | ORAL_TABLET | Freq: Four times a day (QID) | ORAL | Status: AC

## 2021-09-07 NOTE — ED Notes (Signed)
Pt's sister currently at beside. Per pt's sister, she stated pt drinks about a case of alcohol per day.
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
## 2021-09-07 NOTE — ED Notes (Signed)
Pt said they needed to void. Pt provided urinal and this tech assisted pt. Pt could not void at this time.

## 2021-09-07 NOTE — ED Notes (Signed)
Pt given sandwich tray and coke  

## 2021-09-08 MED ORDER — LORAZEPAM 2 MG/ML IJ SOLN: 1.0000 mg | INTRAMUSCULAR | Status: DC | PRN

## 2021-09-08 MED ORDER — LORAZEPAM 1 MG PO TABS: 1.0000 mg | ORAL_TABLET | ORAL | Status: DC | PRN

## 2021-09-08 MED ORDER — FOLIC ACID 1 MG PO TABS
1.0000 mg | ORAL_TABLET | Freq: Every day | ORAL | Status: DC
  Administered 2021-09-08 – 2021-09-11 (×4): 1 mg via ORAL
  Filled 2021-09-08 (×4): qty 1

## 2021-09-08 MED ORDER — THIAMINE HCL 100 MG/ML IJ SOLN: 100.0000 mg | Freq: Every day | INTRAMUSCULAR | Status: DC

## 2021-09-08 MED ORDER — THIAMINE HCL 100 MG PO TABS: 100.0000 mg | ORAL_TABLET | Freq: Every day | ORAL | Status: DC

## 2021-09-08 MED ORDER — ADULT MULTIVITAMIN W/MINERALS CH
1.0000 | ORAL_TABLET | Freq: Every day | ORAL | Status: DC
  Administered 2021-09-08 – 2021-09-11 (×4): 1 via ORAL
  Filled 2021-09-08 (×4): qty 1

## 2021-09-08 NOTE — ED Notes (Signed)
Pending TTS Evaluation for Alcohol Abuse

## 2021-09-08 NOTE — ED Notes (Signed)
Behavioral health at bedside.

## 2021-09-08 NOTE — BH Assessment (Signed)
Writer spoke with patient's niece, Alexia Penick [phone] who states patient is no longer welcome in her home. She reports patient was living with her and patient was doing well until yesterday where she and her daughters found patient on floor exposed. Alexia says to call patient's sister, Marcelino Duster, [phone].   Writer spoke with patient's sister, Marcelino Duster and states she will come pick up patient within the hour.

## 2021-09-08 NOTE — ED Provider Notes (Signed)
Emergency department handoff note  Care of this patient was signed out to me at the end of the previous provider shift.  All pertinent patient information was conveyed and all questions were answered.  Patient pending social work for outpatient resources for alcohol detoxification.  Patient received these and has no further questions upon my reevaluation.  The patient has been reexamined and is ready to be discharged.  All diagnostic results have been reviewed and discussed with the patient/family.  Care plan has been outlined and the patient/family understands all current diagnoses, results, and treatment plans.  There are no new complaints, changes, or physical findings at this time.  All questions have been addressed and answered.  Patient was instructed to, and agrees to follow-up with their primary care physician as well as return to the emergency department if any new or worsening symptoms develop.   Naaman Plummer, MD 09/08/21 [phone]
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
## 2021-09-08 NOTE — ED Notes (Signed)
Lunch tray given. 

## 2021-09-08 NOTE — ED Notes (Signed)
  Patient awake and ambulated to restroom without assistance.  Offered fluids and a snack but patient refused.

## 2021-09-08 NOTE — ED Notes (Signed)
[facility] social work to see what was delay in family. She gave me number to call family.

## 2021-09-08 NOTE — ED Notes (Signed)
This RN attempted to call sister michelle. Goes straight to voicemail.

## 2021-09-08 NOTE — ED Notes (Signed)
This RN called niece listed in chart and brother. They iformed pt is not welcome at their house and he is homeless. The family does not want him.

## 2021-09-08 NOTE — TOC Progression Note (Signed)
Transition of Care [facility]) - Progression Note    Patient Details  Name: Nicholas Ali MRN: [id number] Date of Birth: 07-24-1965  Transition of Care [facility]) CM/SW [place], [place] Phone Number: 09/08/2021, 11:43 AM  Clinical Narrative:     CSW provided patient with outpatient and residential substance use resources via AVS.  No further TOC needs identified at this time.         Expected Discharge Plan and Services                                                 Social Determinants of Health (SDOH) Interventions    Readmission Risk Interventions     View : No data to display.

## 2021-09-08 NOTE — ED Notes (Signed)
Waiting on ride.

## 2021-09-08 NOTE — ED Notes (Signed)
RN to bedside. Pt sleeping at this time.  

## 2021-09-08 NOTE — ED Notes (Signed)
This RN spoke with EDP regarding patient status, pt up for discharge however TOC note states pt's niece and sister state that patient is unable to go back to sister's house, per [facility] note pt is homeless. TOC note states TOC care is ongoing at this time. EDP re-ordered TOC/SW consult for patient and diet order at this time. At this time, pt is TOC/SW border.

## 2021-09-08 NOTE — ED Notes (Signed)
Pt is awake and has no complaints at this time. Given some apple juice.
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
## 2021-09-09 NOTE — ED Provider Notes (Signed)
-----------------------------------------   7:43 AM on 09/09/2021 -----------------------------------------   Blood pressure (!) 142/80, pulse 90, temperature 98 F (36.7 C), temperature source Oral, resp. rate 16, height 1.854 m (6\' 1" ), weight 94.8 kg, SpO2 98 %.  The patient is calm and cooperative at this time.  There have been no acute events since the last update.  Awaiting disposition plan from [facility] team.   [facility], MD 09/09/21 [phone]

## 2021-09-09 NOTE — ED Notes (Signed)
Pt sleeping at this time - attempted to wake pt up by calling name but pt did not respond. Breakfast box placed at bottom of bed.

## 2021-09-09 NOTE — TOC Progression Note (Signed)
Transition of Care [facility]) - Progression Note    Patient Details  Name: Nicholas Ali MRN: [id number] Date of Birth: [birth year]  Transition of Care [facility]) CM/SW Contact  Joseph Art, [place] Phone Number: [phone] 09/09/2021, 1:25 PM  Clinical Narrative:     CSW spoke with patient's nice Alexia [phone], who confirmed the patient will not be allowed to return tot her home due to behaviors in front of the children.  Alexia stated the patient has a history of alcohol abuse and dementia, and she feel he is no longer safe in the home.  CSW explained to Alexia the patient has already been assessed by Behavioral health and he does not meet inpatient criteria.  Patient does not have insurance, and is in the process of applying for Medicaid and Disability.  CSW informed Alexia if a family member was unwilling to pick the patient from the ED, the only other option would be to send the patient to a homeless shelter. Alexia stated she would talk to her aunt Marcelino Duster [phone] and ask her if she could take the patient home. Alexia requested CSW call back later today or tomorrow.       Expected Discharge Plan and Services                                                 Social Determinants of Health (SDOH) Interventions    Readmission Risk Interventions     View : No data to display.

## 2021-09-09 NOTE — ED Notes (Signed)
Dinner tray provided to patient who is calm and cooperative, patient also properly disposed of trash to appropriate area

## 2021-09-09 NOTE — ED Notes (Signed)
Lunch given to patient.

## 2021-09-09 NOTE — ED Provider Notes (Deleted)
-----------------------------------------   7:43 AM on 09/09/2021 -----------------------------------------   Blood pressure (!) 142/80, pulse 90, temperature 98 F (36.7 C), temperature source Oral, resp. rate 16, height 1.854 m (6' 1"), weight 94.8 kg, SpO2 98 %.  The patient is calm and cooperative at this time.  There have been no acute events since the last update.  Awaiting disposition plan from TOC team.   Bertice Risse, MD 09/09/21 0743  

## 2021-09-09 NOTE — ED Notes (Signed)
Vol /pending placement 

## 2021-09-10 NOTE — ED Provider Notes (Signed)
Emergency Medicine Observation Re-evaluation Note  Nicholas Ali is a 56 y.o. male, seen on rounds today.    Physical Exam  BP (!) 107/54 (BP Location: Right Arm)   Pulse 73   Temp 98.2 F (36.8 C) (Oral)   Resp 18   Ht 6\' 1"  (1.854 m)   Wt 94.8 kg   SpO2 96%   BMI 27.57 kg/m  Physical Exam General: Patient resting comfortably in bed Lungs: Patient in no respiratory distress Psych: Patient not combative  ED Course / MDM  EKG:EKG Interpretation  Date/Time:  Saturday Sep 07 2021 17:46:14 EDT Ventricular Rate:  87 PR Interval:  192 QRS Duration: 95 QT Interval:  365 QTC Calculation: 440 R Axis:   -25 Text Interpretation: Sinus rhythm Borderline left axis deviation Confirmed by UNCONFIRMED, DOCTOR (01-10-1986), editor [id number] [phone]) on 09/10/2021 2:19:32 PM   Plan  Current plan is for social work feels the patient is ready for discharge to homeless shelter.  I cannot disagree.09/12/2021 is not under involuntary commitment.     Winfield Rast, MD 09/10/21 [phone]

## 2021-09-10 NOTE — TOC Progression Note (Signed)
Transition of Care [facility]) - Progression Note    Patient Details  Name: Nicholas Ali MRN: [id number] Date of Birth: 01-18-1966  Transition of Care [facility]) CM/SW Contact  Ralston Cellar, RN Phone Number: 09/10/2021, 1:35 PM  Clinical Narrative:    Sherron Monday to niece, Nicholas Ali [phone] who confirmed she had spoken to aunt Nicholas Ali and no family members are willing to assist patient. Updated family that patient would be sent to homeless shelter. Family agreed with no concerns.        Expected Discharge Plan and Services                                                 Social Determinants of Health (SDOH) Interventions    Readmission Risk Interventions     View : No data to display.

## 2021-09-10 NOTE — ED Notes (Signed)
Pt denies any needs currently. Pt stood briefly at bedside then laid back in bed.

## 2021-09-10 NOTE — ED Notes (Signed)
Snack and drink given 

## 2021-09-10 NOTE — ED Notes (Signed)
Upon initial assessment the pt advised he was in no pain but did have a headache.

## 2021-09-10 NOTE — ED Notes (Signed)
Pt attempting to sleep; chest rise and fall noted.

## 2021-09-10 NOTE — ED Notes (Signed)
Pts sister and brother working to be able to pick pt up tonight for safe discharge. Will call when update made.

## 2021-09-10 NOTE — ED Notes (Signed)
Pt alert and calmly laying in bed.

## 2021-09-10 NOTE — ED Notes (Signed)
VOl pending disposition  °

## 2021-09-10 NOTE — Discharge Instructions (Signed)
Please try not to drink too much.  Please follow-up with your doctor or you can try to see [facility] health care or the [place] [facility] or the [facility] or the [facility] or the [facility].  Do not hesitate to return here if need be.  You can always get the ambulance to bring him back if you are sick.  [facility] also has a charity care program that you might be eligible for.

## 2021-09-10 NOTE — ED Notes (Signed)
Nicholas Ali, pts niece called by Clinical research associate due to pts discharge. Pt to be discharged to homeless shelter per note but shelter not willing to take pt. Nicholas reports that she will come pick pt up tomorrow and take him to a safe location. Pt to board ED tonight until safe discharge can occur.

## 2021-09-10 NOTE — ED Notes (Signed)
Pt resting calmly in bed; chest rise and fall noted.

## 2021-09-11 NOTE — ED Notes (Signed)
Pt belongings bag 1/1 given back to pt.

## 2021-09-11 NOTE — ED Notes (Signed)
Pt does not have any belongings documented at this time. Taken to lobby and sister has confirmed they are on their way to get him.

## 2021-09-11 NOTE — ED Notes (Signed)
Pt sleeping in NAD.

## 2021-09-14 ENCOUNTER — Encounter: Payer: Self-pay | Admitting: Emergency Medicine

## 2021-09-14 ENCOUNTER — Other Ambulatory Visit: Payer: Self-pay

## 2021-09-14 ENCOUNTER — Emergency Department
Admission: EM | Admit: 2021-09-14 | Discharge: 2021-09-19 | Disposition: A | Discharge status: Home / Self Care | Payer: No Typology Code available for payment source | Attending: Emergency Medicine | Admitting: Emergency Medicine

## 2021-09-14 DIAGNOSIS — R45851 Suicidal ideations: Secondary | ICD-10-CM | POA: Insufficient documentation

## 2021-09-14 DIAGNOSIS — Y906 Blood alcohol level of 120-199 mg/100 ml: Secondary | ICD-10-CM | POA: Insufficient documentation

## 2021-09-14 DIAGNOSIS — F32A Depression, unspecified: Secondary | ICD-10-CM | POA: Insufficient documentation

## 2021-09-14 DIAGNOSIS — F1019 Alcohol abuse with unspecified alcohol-induced disorder: Secondary | ICD-10-CM | POA: Insufficient documentation

## 2021-09-14 DIAGNOSIS — Z043 Encounter for examination and observation following other accident: Secondary | ICD-10-CM | POA: Diagnosis present

## 2021-09-14 DIAGNOSIS — Z20822 Contact with and (suspected) exposure to covid-19: Secondary | ICD-10-CM | POA: Insufficient documentation

## 2021-09-14 DIAGNOSIS — F1010 Alcohol abuse, uncomplicated: Secondary | ICD-10-CM

## 2021-09-14 DIAGNOSIS — F1014 Alcohol abuse with alcohol-induced mood disorder: Secondary | ICD-10-CM | POA: Diagnosis present

## 2021-09-14 LAB — COMPREHENSIVE METABOLIC PANEL
ALT: 20 U/L (ref 0–44)
AST: 29 U/L (ref 15–41)
Albumin: 4.5 g/dL (ref 3.5–5.0)
Alkaline Phosphatase: 55 U/L (ref 38–126)
Anion gap: 9 (ref 5–15)
BUN: 13 mg/dL (ref 6–20)
CO2: 22 mmol/L (ref 22–32)
Calcium: 9.6 mg/dL (ref 8.9–10.3)
Chloride: 105 mmol/L (ref 98–111)
Creatinine, Ser: 1.1 mg/dL (ref 0.61–1.24)
GFR, Estimated: >60 mL/min (ref >60)
Glucose, Bld: 98 mg/dL (ref 70–99)
Potassium: 3.8 mmol/L (ref 3.5–5.1)
Sodium: 136 mmol/L (ref 135–145)
Total Bilirubin: 0.6 mg/dL (ref 0.3–1.2)
Total Protein: 8.6 g/dL — ABNORMAL HIGH (ref 6.5–8.1)

## 2021-09-14 LAB — ETHANOL: Alcohol, Ethyl (B): 178 mg/dL — ABNORMAL HIGH (ref <10)

## 2021-09-14 LAB — CBC
HCT: 46.7 % (ref 39.0–52.0)
Hemoglobin: 15.5 g/dL (ref 13.0–17.0)
MCH: 28.2 pg (ref 26.0–34.0)
MCHC: 33.2 g/dL (ref 30.0–36.0)
MCV: 84.9 fL (ref 80.0–100.0)
Platelets: 237 10*3/uL (ref 150–400)
RBC: 5.5 MIL/uL (ref 4.22–5.81)
RDW: 12.7 % (ref 11.5–15.5)
WBC: 7.9 10*3/uL (ref 4.0–10.5)
nRBC: 0 % (ref 0.0–0.2)

## 2021-09-14 LAB — URINE DRUG SCREEN, QUALITATIVE (ARMC ONLY)
Amphetamines, Ur Screen: NOT DETECTED
Barbiturates, Ur Screen: NOT DETECTED
Benzodiazepine, Ur Scrn: NOT DETECTED
Cannabinoid 50 Ng, Ur ~~LOC~~: NOT DETECTED
Cocaine Metabolite,Ur ~~LOC~~: NOT DETECTED
MDMA (Ecstasy)Ur Screen: NOT DETECTED
Methadone Scn, Ur: NOT DETECTED
Opiate, Ur Screen: NOT DETECTED
Phencyclidine (PCP) Ur S: NOT DETECTED
Tricyclic, Ur Screen: NOT DETECTED

## 2021-09-14 LAB — SALICYLATE LEVEL: Salicylate Lvl: <7 mg/dL — ABNORMAL LOW (ref 7.0–30.0)

## 2021-09-14 LAB — RESP PANEL BY RT-PCR (FLU A&B, COVID) ARPGX2
Influenza A by PCR: NEGATIVE
Influenza B by PCR: NEGATIVE
SARS Coronavirus 2 by RT PCR: NEGATIVE

## 2021-09-14 LAB — ACETAMINOPHEN LEVEL: Acetaminophen (Tylenol), Serum: <10 ug/mL — ABNORMAL LOW (ref 10–30)

## 2021-09-14 MED ORDER — THIAMINE HCL 100 MG PO TABS
100.0000 mg | ORAL_TABLET | Freq: Every day | ORAL | Status: DC
  Administered 2021-09-15 – 2021-09-19 (×5): 100 mg via ORAL
  Filled 2021-09-14 (×5): qty 1

## 2021-09-14 MED ORDER — HYDROXYZINE HCL 25 MG PO TABS: 25.0000 mg | ORAL_TABLET | Freq: Four times a day (QID) | ORAL | Status: AC | PRN

## 2021-09-14 MED ORDER — LORAZEPAM 1 MG PO TABS
1.0000 mg | ORAL_TABLET | Freq: Three times a day (TID) | ORAL | Status: AC
  Administered 2021-09-15 (×3): 1 mg via ORAL
  Filled 2021-09-14 (×3): qty 1

## 2021-09-14 MED ORDER — LORAZEPAM 1 MG PO TABS: 1.0000 mg | ORAL_TABLET | Freq: Four times a day (QID) | ORAL | Status: AC | PRN

## 2021-09-14 MED ORDER — LORAZEPAM 1 MG PO TABS
1.0000 mg | ORAL_TABLET | Freq: Four times a day (QID) | ORAL | Status: AC
  Administered 2021-09-14 (×4): 1 mg via ORAL
  Filled 2021-09-14 (×4): qty 1

## 2021-09-14 MED ORDER — THIAMINE HCL 100 MG/ML IJ SOLN
100.0000 mg | Freq: Once | INTRAMUSCULAR | Status: AC
  Administered 2021-09-14: 100 mg via INTRAMUSCULAR
  Filled 2021-09-14: qty 2

## 2021-09-14 MED ORDER — ONDANSETRON 4 MG PO TBDP: 4.0000 mg | ORAL_TABLET | Freq: Four times a day (QID) | ORAL | Status: AC | PRN

## 2021-09-14 MED ORDER — ADULT MULTIVITAMIN W/MINERALS CH
1.0000 | ORAL_TABLET | Freq: Every day | ORAL | Status: DC
  Administered 2021-09-14 – 2021-09-19 (×6): 1 via ORAL
  Filled 2021-09-14 (×6): qty 1

## 2021-09-14 MED ORDER — LOPERAMIDE HCL 2 MG PO CAPS: 2.0000 mg | ORAL_CAPSULE | ORAL | Status: AC | PRN

## 2021-09-14 MED ORDER — LORAZEPAM 1 MG PO TABS
1.0000 mg | ORAL_TABLET | Freq: Every day | ORAL | Status: AC
  Administered 2021-09-17: 1 mg via ORAL
  Filled 2021-09-14: qty 1

## 2021-09-14 MED ORDER — LORAZEPAM 1 MG PO TABS
1.0000 mg | ORAL_TABLET | Freq: Two times a day (BID) | ORAL | Status: AC
  Administered 2021-09-16 (×2): 1 mg via ORAL
  Filled 2021-09-14 (×2): qty 1

## 2021-09-14 NOTE — ED Notes (Signed)
Pt appears to be sleeping, even RR and unlabored, NAD noted, sitter within view of pt for safety, room secured, care on going, will continue to monitor.  

## 2021-09-14 NOTE — ED Triage Notes (Signed)
Pt present to the ER from home with complaints of visual hallucinations, reports he is depressed dealing with a lot of problems and having some SI denies HI. Pt is intoxicated. Reports he drinks he drinks a beer every day.
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
## 2021-09-14 NOTE — ED Notes (Signed)
Pt continues to sleep, even RR and unlabored, NAD noted, sitter within view of pt for safety, room secured, care on going, will continue to monitor.  

## 2021-09-14 NOTE — ED Notes (Signed)
Pt dressed out in burgundy scrubs.  Pts belongings, include, jeans, underwear, socks, sneakers, long sleeve gold colored shirt, 1 white and 1 blue short sleeved tshirt, hat, cigs, bagged, labelled and placed at nurses station.  Pt waiting in subwait with PD.

## 2021-09-14 NOTE — ED Provider Notes (Signed)
[facility] Provider Note    Event Date/Time   First MD Initiated Contact with Patient 09/14/21 0309     (approximate)   History   Chief Complaint Depression and Psychiatric Evaluation   HPI  Nicholas Ali is a 56 y.o. male with past medical history of alcohol abuse who presents to the ED for psychiatric evaluation.  Patient reports that he has been increasingly depressed over the past week and is having thoughts of harming himself.  He states he is "dealing with a lot of problems" but is unable to further explain.  He denies any specific plan on how he would harm himself.  He states that he drinks beer on a daily basis and had at least 12 beers this this evening, denies any drug use.  He denies any medical complaints.     Physical Exam   Triage Vital Signs: ED Triage Vitals  Enc Vitals Group     BP 09/14/21 0235 123/87     Pulse Rate 09/14/21 0235 92     Resp 09/14/21 0235 16     Temp 09/14/21 0235 98 F (36.7 C)     Temp Source 09/14/21 0235 Oral     SpO2 09/14/21 0235 97 %     Weight 09/14/21 0237 208 lb 15.9 oz (94.8 kg)     Height 09/14/21 0237 6\' 1"  (1.854 m)     Head Circumference --      Peak Flow --      Pain Score --      Pain Loc --      Pain Edu? --      Excl. in GC? --     Most recent vital signs: Vitals:   09/14/21 0327 09/14/21 0329  BP: 98/63 98/63  Pulse: 83 83  Resp:  16  Temp:  98.3 F (36.8 C)  SpO2:  96%    Constitutional: Alert and oriented. Eyes: Conjunctivae are normal. Head: Atraumatic. Nose: No congestion/rhinnorhea. Mouth/Throat: Mucous membranes are moist.  Cardiovascular: Normal rate, regular rhythm. Grossly normal heart sounds.  2+ radial pulses bilaterally. Respiratory: Normal respiratory effort.  No retractions. Lungs CTAB. Gastrointestinal: Soft and nontender. No distention. Musculoskeletal: No lower extremity tenderness nor edema.  Neurologic:  Normal speech and language. No gross focal neurologic  deficits are appreciated.    ED Results / Procedures / Treatments   Labs (all labs ordered are listed, but only abnormal results are displayed) Labs Reviewed  COMPREHENSIVE METABOLIC PANEL - Abnormal; Notable for the following components:      Result Value   Total Protein 8.6 (*)    All other components within normal limits  ETHANOL - Abnormal; Notable for the following components:   Alcohol, Ethyl (B) 178 (*)    All other components within normal limits  SALICYLATE LEVEL - Abnormal; Notable for the following components:   Salicylate Lvl <7.0 (*)    All other components within normal limits  ACETAMINOPHEN LEVEL - Abnormal; Notable for the following components:   Acetaminophen (Tylenol), Serum <10 (*)    All other components within normal limits  CBC  URINE DRUG SCREEN, QUALITATIVE ([facility] ONLY)    PROCEDURES:  Critical Care performed: No  Procedures   MEDICATIONS ORDERED IN ED: Medications - No data to display   IMPRESSION / MDM / ASSESSMENT AND PLAN / ED COURSE  I reviewed the triage vital signs and the nursing notes.  56 y.o. male with past medical history of alcohol abuse who presents to the ED for psychiatric evaluation due to increasing depression and thoughts of suicide without a plan.  Patient's presentation is most consistent with acute presentation with potential threat to life or bodily function.  Differential diagnosis includes, but is not limited to, depression, suicidal ideation, psychosis, alcohol abuse, electrolyte abnormality.  Patient is intoxicated appearing but overall well with reassuring vital signs.  He denies any medical complaints and screening labs are remarkable only for elevated ethanol level.  CBC shows no anemia or leukocytosis, BMP without electrolyte abnormality or AKI, LFTs within normal limits.  Patient may be medically cleared for psychiatric evaluation, he is calm and cooperative and appears interested in  receiving treatment, we will hold off on IVC for now.  The patient has been placed in psychiatric observation due to the need to provide a safe environment for the patient while obtaining psychiatric consultation and evaluation, as well as ongoing medical and medication management to treat the patient's condition.  The patient has not been placed under full IVC at this time.      FINAL CLINICAL IMPRESSION(S) / ED DIAGNOSES   Final diagnoses:  Depression, unspecified depression type  Suicidal ideation  Alcohol abuse     Rx / DC Orders   ED Discharge Orders     None        Note:  This document was prepared using Dragon voice recognition software and may include unintentional dictation errors.   Chesley Noon, MD 09/14/21 [phone]

## 2021-09-14 NOTE — ED Notes (Signed)
Lunch tray given. 

## 2021-09-14 NOTE — BH Assessment (Signed)
Comprehensive Clinical Assessment (CCA) Note  09/14/2021 Nicholas Ali [id number] Recommendations for Services/Supports/Treatments: Pt pending psych consult/disposition.  Nicholas Ali is a 56 year old patient with a hx of alcohol abuse, alcohol delirium, and alcohol withdrawal who presented to [facility] ED voluntarily with complaints of hallucinations, depression, and endorsing SI. Per triage note, Pt presented to the ER from home with complaints of visual hallucinations, reports he is depressed dealing with a lot of problems and having some SI denies HI. Upon interview, pt. had a sad mood and a flat affect. Pt states, "I need to make sure I'm aight." Pt unable to identify specific stressors. Pt reported that he lives with his niece and things are going well. Pt endorsed having symptoms of depression, with symptoms being present for 1 week. Pt reports no hx of inpatient hospitalizations or suicide attempts. Pt is oriented x4 and had linear thought processes. Pt had slurred speech and made no eye contact.  Pt denied current SI, but admitted to having chronic, passive SI over the course of his life. Pt endorsed VH, denying AH and HI. Pt's BAL is 178; UDS is pending.    Chief Complaint:  Chief Complaint  Patient presents with   Depression   Psychiatric Evaluation   Visit Diagnosis: Alcohol abuse   History of head injury    CCA Screening, Triage and Referral (STR)  Patient Reported Information How did you hear about [place]? Self  Referral name: No data recorded Referral phone number: No data recorded  Whom do you see for routine medical problems? No data recorded Practice/Facility Name: No data recorded Practice/Facility Phone Number: No data recorded Name of Contact: No data recorded Contact Number: No data recorded Contact Fax Number: No data recorded Prescriber Name: No data recorded Prescriber Address (if known): No data recorded  What Is the Reason for Your Visit/Call Today? Pt present to  the ER from home with complaints of visual hallucinations, reports he is depressed dealing with a lot of problems and having some SI denies HI.  How Long Has This Been Causing You Problems? > than 6 months  What Do You Feel Would Help You the Most Today? Treatment for Depression or other mood problem   Have You Recently Been in Any Inpatient Treatment (Hospital/Detox/Crisis Center/28-Day Program)? No data recorded Name/Location of Program/Hospital:No data recorded How Long Were You There? No data recorded When Were You Discharged? No data recorded  Have You Ever Received Services From [facility] Before? No data recorded Who Do You See at [facility]? No data recorded  Have You Recently Had Any Thoughts About Hurting Yourself? Yes  Are You Planning to Commit Suicide/Harm Yourself At This time? No   Have you Recently Had Thoughts About Hurting Someone Karolee Ohs? No  Explanation: No data recorded  Have You Used Any Alcohol or Drugs in the Past 24 Hours? Yes  How Long Ago Did You Use Drugs or Alcohol? No data recorded What Did You Use and How Much? Alcohol; "a couple of beers"   Do You Currently Have a Therapist/Psychiatrist? No  Name of Therapist/Psychiatrist: No data recorded  Have You Been Recently Discharged From Any Office Practice or Programs? No  Explanation of Discharge From Practice/Program: No data recorded    CCA Screening Triage Referral Assessment Type of Contact: Face-to-Face  Is this Initial or Reassessment? No data recorded Date Telepsych consult ordered in [facility]:  No data recorded Time Telepsych consult ordered in [facility]:  No data recorded  Patient Reported Information Reviewed? No data  recorded Patient Left Without Being Seen? No data recorded Reason for Not Completing Assessment: No data recorded  Collateral Involvement: None provided   Does Patient Have a Court Appointed Legal Guardian? No data recorded Name and Contact of Legal Guardian: No data  recorded If Minor and Not Living with Parent(s), Who has Custody? n/a  Is CPS involved or ever been involved? Never  Is APS involved or ever been involved? Never   Patient Determined To Be At Risk for Harm To Self or Others Based on Review of Patient Reported Information or Presenting Complaint? No  Method: No data recorded Availability of Means: No data recorded Intent: No data recorded Notification Required: No data recorded Additional Information for Danger to Others Potential: No data recorded Additional Comments for Danger to Others Potential: No data recorded Are There Guns or Other Weapons in Your Home? No data recorded Types of Guns/Weapons: No data recorded Are These Weapons Safely Secured?                            No data recorded Who Could Verify You Are Able To Have These Secured: No data recorded Do You Have any Outstanding Charges, Pending Court Dates, Parole/Probation? No data recorded Contacted To Inform of Risk of Harm To Self or Others: No data recorded  [facility]   Does Patient Present under Involuntary Commitment? No  IVC Papers Initial File Date: 11/29/20   [place]   Patient Currently Receiving the Following Services: Not Receiving Services   Determination of Need: Emergent (2 hours)   Options For Referral: ED Visit; Therapeutic Triage Services     CCA Biopsychosocial Intake/Chief Complaint:  No data recorded Current Symptoms/Problems: No data recorded  Patient Reported Schizophrenia/Schizoaffective Diagnosis in Past: No   Strengths: Pt is receptive to treatment  Preferences: No data recorded Abilities: No data recorded  Type of Services Patient Feels are Needed: No data recorded  Initial Clinical Notes/Concerns: No data recorded  Mental Health Symptoms Depression:   Hopelessness; Sleep (too much or little); Worthlessness   Duration of Depressive symptoms:  Less than two weeks    Mania:   None   Anxiety:    N/A   Psychosis:   Hallucinations   Duration of Psychotic symptoms:  Greater than six months   Trauma:   N/A   Obsessions:   N/A   Compulsions:   "Driven" to perform behaviors/acts; Repeated behaviors/mental acts; Disrupts with routine/functioning; Poor Insight   Inattention:   None   Hyperactivity/Impulsivity:   None   Oppositional/Defiant Behaviors:   None   Emotional Irregularity:   None   Other Mood/Personality Symptoms:  No data recorded   Mental Status Exam Appearance and self-care  Stature:   Average   Weight:   Average weight   Clothing:   -- (Pt in scrubs)   Grooming:   Neglected   Cosmetic use:   None   Posture/gait:   Normal   Motor activity:   Not Remarkable   Sensorium  Attention:   Inattentive   Concentration:   Variable   Orientation:   Object; Person; Place; Situation   Recall/memory:   Normal   Affect and Mood  Affect:   Blunted   Mood:   Depressed   Relating  Eye contact:   None   Facial expression:   Constricted   Attitude toward examiner:   Passive; Cooperative   Thought and Language  Speech flow:  Slurred   Thought content:   Appropriate to Mood and Circumstances   Preoccupation:   None   Hallucinations:   Auditory   Organization:  No data recorded  [employer] of Knowledge:   Fair   Intelligence:   Average   Abstraction:   Functional   Judgement:   Poor   Reality Testing:   Adequate   Insight:   Shallow; Present   Decision Making:   Impulsive   Social Functioning  Social Maturity:   Isolates   Social Judgement:   Heedless   Stress  Stressors:   Other (Comment) (Pt unable to identify stressors)   Coping Ability:   Exhausted   Skill Deficits:   Decision making   Supports:   Support needed; Family     Religion: Religion/Spirituality Are You A Religious Person?:  (Not assessed)  Leisure/Recreation: Leisure  / Recreation Do You Have Hobbies?: No  Exercise/Diet: Exercise/Diet Do You Exercise?: No Have You Gained or Lost A Significant Amount of Weight in the Past Six Months?: No Do You Follow a Special Diet?: No Do You Have Any Trouble Sleeping?: Yes Explanation of Sleeping Difficulties: Pt reported having difficulty falling asleep.   CCA Employment/Education Employment/Work Situation: Employment / Work Situation Employment Situation: Unemployed Patient's Job has Been Impacted by Current Illness: No Has Patient ever Been in Equities trader?: No  Education: Education Is Patient Currently Attending School?: No Did Theme park manager?: No Did You Have An Individualized Education Program (IIEP): No Did You Have Any Difficulty At [employer]?: No Patient's Education Has Been Impacted by Current Illness: No   CCA Family/Childhood History Family and Relationship History: Family history Marital status: Single Does patient have children?: No  Childhood History:  Childhood History By whom was/is the patient raised?: Mother Did patient suffer any verbal/emotional/physical/sexual abuse as a child?: No Did patient suffer from severe childhood neglect?: No Has patient ever been sexually abused/assaulted/raped as an adolescent or adult?: No Was the patient ever a victim of a crime or a disaster?: No Witnessed domestic violence?: No Has patient been affected by domestic violence as an adult?: No  Child/Adolescent Assessment:     CCA Substance Use Alcohol/Drug Use: Alcohol / Drug Use Pain Medications: See MAR Prescriptions: See MAR Over the Counter: See MAR History of alcohol / drug use?: Yes Longest period of sobriety (when/how long): 1 year Negative Consequences of Use: Personal relationships Withdrawal Symptoms: Change in blood pressure, Other (Comment) (Dizziness) Substance #1 Name of Substance 1: Alcohol 1 - Age of First Use: 15 1 - Amount (size/oz): 1 beer 1 - Frequency: Daily 1  - Duration: Ongoing 1 - Last Use / Amount: 09/13/21                       ASAM's:  Six Dimensions of Multidimensional Assessment  Dimension 1:  Acute Intoxication and/or Withdrawal Potential:   Dimension 1:  Description of individual's past and current experiences of substance use and withdrawal: Pt has an extensive hx of alcohol abuse  Dimension 2:  Biomedical Conditions and Complications:   Dimension 2:  Description of patient's biomedical conditions and  complications: None on file  Dimension 3:  Emotional, Behavioral, or Cognitive Conditions and Complications:  Dimension 3:  Description of emotional, behavioral, or cognitive conditions and complications: Pt has a hx of depression  Dimension 4:  Readiness to Change:  Dimension 4:  Description of Readiness to Change criteria: Pt expresses a  motivation to change  Dimension 5:  Relapse, Continued use, or Continued Problem Potential:     Dimension 6:  Recovery/Living Environment:     ASAM Severity Score: ASAM's Severity Rating Score: 17  ASAM Recommended Level of Treatment: ASAM Recommended Level of Treatment: Level III Residential Treatment   Substance use Disorder (SUD) Substance Use Disorder (SUD)  Checklist Symptoms of Substance Use: Continued use despite having a persistent/recurrent physical/psychological problem caused/exacerbated by use, Evidence of tolerance, Presence of craving or strong urge to use, Social, occupational, recreational activities given up or reduced due to use, Recurrent use that results in a failure to fulfill major role obligations (work, school, home), Large amounts of time spent to obtain, use or recover from the substance(s), Substance(s) often taken in larger amounts or over longer times than was intended, Continued use despite persistent or recurrent social, interpersonal problems, caused or exacerbated by use, Persistent desire or unsuccessful efforts to cut down or control use, Repeated use in physically  hazardous situations  Recommendations for Services/Supports/Treatments: Recommendations for Services/Supports/Treatments Recommendations For Services/Supports/Treatments: Detox  DSM5 Diagnoses: Patient Active Problem List   Diagnosis Date Noted   Acute delirium 07/03/2017   Alcohol abuse 07/03/2017   History of head injury 07/03/2017    Richey Doolittle R Haruna Rohlfs, LCAS

## 2021-09-14 NOTE — ED Notes (Signed)
Meal given to pt.

## 2021-09-14 NOTE — BH Assessment (Signed)
Referral information for Detox and SA Treatment faxed to;   [facility] [phone]),   [facility] [phone])  [facility] [phone])  [facility] [phone]),  [facility] [phone])  [facility] [phone] or [phone])  [facility] [phone]),   [facility] [phone] -or- [phone]),   [facility] [phone])  [facility] [phone]).  [facility] [phone])  [employer] [phone])

## 2021-09-14 NOTE — Consult Note (Signed)
[facility] Face-to-Face Psychiatry Consult   Reason for Consult:  alcohol intoxication with some psychosis Referring Physician:  EDP Patient Identification: Nicholas Ali MRN:  [id number] Principal Diagnosis: Alcohol abuse with alcohol-induced mood disorder (HCC) Diagnosis:  Principal Problem:   Alcohol abuse with alcohol-induced mood disorder (HCC)   Total Time spent with patient: 45 minutes  Subjective:   Nicholas Ali is a 56 y.o. male patient admitted with alcohol intoxication and psychosis.  HPI:  56 yo male presented to the ED after using alcohol and having hallucinations and suicidal ideations.  On assessment, he is calm and cooperative with no suicidal/homicidal ideations, hallucinations.  Some anxiety related to alcohol withdrawal.  He is interested in detox/rehab, last time was at [facility] in [place].  Psychiatrically cleared for [facility] to seek detox/rehab.  Past Psychiatric History: alcohol use disorder  Risk to Self:  none Risk to Others:  none Prior Inpatient Therapy:  detox/rehab [facility] Prior Outpatient Therapy:  none  Past Medical History:  Past Medical History:  Diagnosis Date   Alcohol abuse    Epistaxis    History reviewed. No pertinent surgical history. Family History: No family history on file. Family Psychiatric  History: none Social History:  Social History   Substance and Sexual Activity  Alcohol Use Yes     Social History   Substance and Sexual Activity  Drug Use No    Social History   Socioeconomic History   Marital status: Single    Spouse name: Not on file   Number of children: Not on file   Years of education: Not on file   Highest education level: Not on file  Occupational History   Not on file  Tobacco Use   Smoking status: Never   Smokeless tobacco: Former  Substance and Sexual Activity   Alcohol use: Yes   Drug use: No   Sexual activity: Not on file  Other Topics Concern   Not on file  Social History Narrative   Not on file    Social Determinants of Health   Financial Resource Strain: Not on file  Food Insecurity: Not on file  Transportation Needs: Not on file  Physical Activity: Not on file  Stress: Not on file  Social Connections: Not on file   Additional Social History:    Allergies:  No Known Allergies  Labs:  Results for orders placed or performed during the hospital encounter of 09/14/21 (from the past 48 hour(s))  Comprehensive metabolic panel     Status: Abnormal   Collection Time: 09/14/21  2:43 AM  Result Value Ref Range   Sodium 136 135 - 145 mmol/L   Potassium 3.8 3.5 - 5.1 mmol/L   Chloride 105 98 - 111 mmol/L   CO2 22 22 - 32 mmol/L   Glucose, Bld 98 70 - 99 mg/dL    Comment: Glucose reference range applies only to samples taken after fasting for at least 8 hours.   BUN 13 6 - 20 mg/dL   Creatinine, Ser 0.76 0.61 - 1.24 mg/dL   Calcium 9.6 8.9 - 22.6 mg/dL   Total Protein 8.6 (H) 6.5 - 8.1 g/dL   Albumin 4.5 3.5 - 5.0 g/dL   AST 29 15 - 41 U/L   ALT 20 0 - 44 U/L   Alkaline Phosphatase 55 38 - 126 U/L   Total Bilirubin 0.6 0.3 - 1.2 mg/dL   GFR, Estimated >33 >35 mL/min    Comment: (NOTE) Calculated using the CKD-EPI Creatinine Equation (2021)  Anion gap 9 5 - 15    Comment: Performed at [facility], [address] [place], [place] [address]  Ethanol     Status: Abnormal   Collection Time: 09/14/21  2:43 AM  Result Value Ref Range   Alcohol, Ethyl (B) 178 (H) <10 mg/dL    Comment: (NOTE) Lowest detectable limit for serum alcohol is 10 mg/dL.  For medical purposes only. Performed at [facility], [address] [place], [place] [address]   Salicylate level     Status: Abnormal   Collection Time: 09/14/21  2:43 AM  Result Value Ref Range   Salicylate Lvl <7.0 (L) 7.0 - 30.0 mg/dL    Comment: Performed at [facility], [address] [place], [place] [address]  Acetaminophen level     Status: Abnormal   Collection Time: 09/14/21   2:43 AM  Result Value Ref Range   Acetaminophen (Tylenol), Serum <10 (L) 10 - 30 ug/mL    Comment: (NOTE) Therapeutic concentrations vary significantly. A range of 10-30 ug/mL  may be an effective concentration for many patients. However, some  are best treated at concentrations outside of this range. Acetaminophen concentrations >150 ug/mL at 4 hours after ingestion  and >50 ug/mL at 12 hours after ingestion are often associated with  toxic reactions.  Performed at [facility], [address] [place], [place] [address]   cbc     Status: None   Collection Time: 09/14/21  2:43 AM  Result Value Ref Range   WBC 7.9 4.0 - 10.5 K/uL   RBC 5.50 4.22 - 5.81 MIL/uL   Hemoglobin 15.5 13.0 - 17.0 g/dL   HCT 57.3 22.0 - 25.4 %   MCV 84.9 80.0 - 100.0 fL   MCH 28.2 26.0 - 34.0 pg   MCHC 33.2 30.0 - 36.0 g/dL   RDW 27.0 62.3 - 76.2 %   Platelets 237 150 - 400 K/uL   nRBC 0.0 0.0 - 0.2 %    Comment: Performed at [facility], [address] [place], [place] [address]    No current facility-administered medications for this encounter.   Current Outpatient Medications  Medication Sig Dispense Refill   B Complex-C-Folic Acid (B COMPLEX-VITAMIN C-FOLIC ACID) 1 MG tablet Take 1 tablet by mouth daily with breakfast. (Patient not taking: Reported on 09/07/2021) 90 tablet 3   thiamine 100 MG tablet Take 1 tablet (100 mg total) by mouth daily. (Patient not taking: Reported on 09/07/2021) 90 tablet 3   traZODone (DESYREL) 50 MG tablet Take 50 mg by mouth at bedtime as needed. (Patient not taking: Reported on 09/07/2021)      Musculoskeletal: Strength & Muscle Tone: within normal limits Gait & Station: normal Patient leans: N/A  Psychiatric Specialty Exam: Physical Exam Vitals and nursing note reviewed.  Constitutional:      Appearance: Normal appearance.  HENT:     Head: Normocephalic.     Nose: Nose normal.  Pulmonary:     Effort: Pulmonary effort is normal.   Musculoskeletal:        General: Normal range of motion.     Cervical back: Normal range of motion.  Neurological:     General: No focal deficit present.     Mental Status: He is alert and oriented to person, place, and time.  Psychiatric:        Attention and Perception: Attention and perception normal.        Mood and Affect: Mood is anxious.  Speech: Speech normal.        Behavior: Behavior normal. Behavior is cooperative.        Thought Content: Thought content normal.        Cognition and Memory: Cognition and memory normal.        Judgment: Judgment normal.    Review of Systems  Constitutional:  Positive for malaise/fatigue.  Psychiatric/Behavioral:  Positive for substance abuse. The patient is nervous/anxious.   All other systems reviewed and are negative.  Blood pressure 107/66, pulse 80, temperature 98.3 F (36.8 C), resp. rate 15, height 6\' 1"  (1.854 m), weight 94.8 kg, SpO2 92 %.Body mass index is 27.57 kg/m.  General Appearance: Casual  Eye Contact:  Good  Speech:  Normal Rate  Volume:  Normal  Mood:  Anxious  Affect:  Congruent  Thought Process:  Coherent and Descriptions of Associations: Intact  Orientation:  Full (Time, Place, and Person)  Thought Content:  WDL and Logical  Suicidal Thoughts:  No  Homicidal Thoughts:  No  Memory:  Immediate;   Fair Recent;   Fair Remote;   Fair  Judgement:  Fair  Insight:  Fair  Psychomotor Activity:  Decreased  Concentration:  Concentration: Fair and Attention Span: Fair  Recall:  FiservFair  Fund of Knowledge:  Fair  Language:  Good  Akathisia:  No  Handed:  Right  AIMS (if indicated):     Assets:  Housing Leisure Time Resilience Social Support  ADL's:  Intact  Cognition:  WNL  Sleep:        Physical Exam: Physical Exam Vitals and nursing note reviewed.  Constitutional:      Appearance: Normal appearance.  HENT:     Head: Normocephalic.     Nose: Nose normal.  Pulmonary:     Effort: Pulmonary effort  is normal.  Musculoskeletal:        General: Normal range of motion.     Cervical back: Normal range of motion.  Neurological:     General: No focal deficit present.     Mental Status: He is alert and oriented to person, place, and time.  Psychiatric:        Attention and Perception: Attention and perception normal.        Mood and Affect: Mood is anxious.        Speech: Speech normal.        Behavior: Behavior normal. Behavior is cooperative.        Thought Content: Thought content normal.        Cognition and Memory: Cognition and memory normal.        Judgment: Judgment normal.   Review of Systems  Constitutional:  Positive for malaise/fatigue.  Psychiatric/Behavioral:  Positive for substance abuse. The patient is nervous/anxious.   All other systems reviewed and are negative. Blood pressure 107/66, pulse 80, temperature 98.3 F (36.8 C), resp. rate 15, height 6\' 1"  (1.854 m), weight 94.8 kg, SpO2 92 %. Body mass index is 27.57 kg/m.  Treatment Plan Summary: Alcohol abuse with alcohol induced mood disorder: Detox / rehab Continue Ativan detox protocol  Insomnia: Continue Trazodone 50 mg PRN  Disposition: No evidence of imminent risk to self or others at present.   Patient does not meet criteria for psychiatric inpatient admission.  Nanine MeansJamison Dominic Mahaney, NP 09/14/2021 10:41 AM

## 2021-09-14 NOTE — ED Notes (Signed)
Dr. Jessup at the bedside 

## 2021-09-14 NOTE — ED Notes (Signed)
Pt given water, saltine crackers, and graham cracker. Pt given a urinal, reminded of urine sample.

## 2021-09-15 NOTE — ED Notes (Signed)
VOL/Consult completed/Pending Placement 

## 2021-09-15 NOTE — BH Assessment (Addendum)
Referral check:  Nicholas Ali ([phone]), Spoke with charge nurse who was supposed to review the patient's referral at 8:30pm but no call back. This Probation officer contacted facility at 9:40pm and spoke with another staff person who reports that the charge nurse left and will leave referral information for the next charge nurse that will come in later tonight, contact info was left with staff to return phone call  [facility] ([phone]) spoke with Elta Guadeloupe who reports no beds available tonight but beds will be available tomorrow morning 09/16/21

## 2021-09-15 NOTE — ED Notes (Signed)
Report to include Situation, Background, Assessment, and Recommendations received from Katie RN. Patient alert and oriented, warm and dry, in no acute distress. Patient denies SI, HI, AVH and pain. Patient made aware of Q15 minute rounds and security cameras for their safety. Patient instructed to come to me with needs or concerns.  

## 2021-09-15 NOTE — BH Assessment (Addendum)
Referral information for Detox & SA Treatment refaxed to;   [facility] ([phone]-or- [phone]), Declined due to no program. "He's requesting detox." Mental Health have to be primary dx.  [facility] [phone]), currently closed for admission. For approximately one to two weeks.  [facility] ([phone]), Pending review.  [facility] ([phone] or [phone]), No beds.  [facility] [phone] -or- [phone]), Denied for [facility] ([phone]), Per their request, information was refaxed.  [facility] [phone]), unable to reach anyone, number no longer in service.  [facility] [phone]), unable to reach anyone. Left a message requesting a return phone call.

## 2021-09-15 NOTE — ED Notes (Signed)
dinner tray given 

## 2021-09-15 NOTE — ED Notes (Signed)
Snack and beverage given. 

## 2021-09-15 NOTE — ED Notes (Signed)
Pt. Transferred to BHU , room# 4 from main ed .Patient was screened by security before entering the unit. Report to include Situation, Background, Assessment and Recommendations from Kimberlee Nearing, RN . Pt. Oriented to unit including Q15 minute rounds as well as the security cameras for their protection. Patient is alert and oriented, warm and dry in no acute distress.

## 2021-09-16 NOTE — ED Provider Notes (Signed)
Emergency Medicine Observation Re-evaluation Note  Nicholas Ali is a 56 y.o. male, seen on rounds today.  Pt initially presented to the ED for complaints of Depression and Psychiatric Evaluation   Physical Exam  BP 116/85 (BP Location: Left Arm)   Pulse 67   Temp 98.1 F (36.7 C) (Oral)   Resp 17   Ht 6\' 1"  (1.854 m)   Wt 94.8 kg   SpO2 98%   BMI 27.57 kg/m  Physical Exam General: NAD Cardiac: Well perfused Lungs: Breathing comfortably  ED Course / MDM  EKG:   I have reviewed the labs performed to date as well as medications administered while in observation.  Recent changes in the last 24 hours include none.  Plan  Current plan is for [facility] placement.  Nicholas Ali is not under involuntary commitment.     Nicholas Divine, MD 09/16/21 1044

## 2021-09-16 NOTE — ED Notes (Signed)
Meal given to pt.

## 2021-09-16 NOTE — ED Notes (Signed)
Lunch tray given. 

## 2021-09-16 NOTE — ED Notes (Signed)
Patient allowed housekeeping to clean room at this time

## 2021-09-16 NOTE — ED Notes (Signed)
Patient given dinner tray.

## 2021-09-16 NOTE — ED Notes (Signed)
Given snack. 

## 2021-09-17 NOTE — ED Notes (Signed)
Patient received lunch tray and beverage. 

## 2021-09-17 NOTE — ED Notes (Signed)
Patient ate 100% of meal and beverage.  

## 2021-09-17 NOTE — ED Notes (Signed)
Patient pleasant and cooperative, took all medications without any difficulty, will continue to monitor.

## 2021-09-17 NOTE — ED Provider Notes (Signed)
Emergency Medicine Observation Re-evaluation Note  Nicholas Ali is a 56 y.o. male, seen on rounds today.  Pt initially presented to the ED for complaints of Depression and Psychiatric Evaluation   Physical Exam  BP 121/83   Pulse 79   Temp 98.1 F (36.7 C) (Oral)   Resp 17   Ht 6\' 1"  (1.854 m)   Wt 94.8 kg   SpO2 97%   BMI 27.57 kg/m  Physical Exam General: NAD Cardiac: Well perfused Lungs: Breathing comfortably  Sitting in bed, no distress.  Reports no concerns or needs.  Alert pleasant and oriented  ED Course / MDM  EKG:   I have reviewed the labs performed to date as well as medications administered while in observation.  Recent changes in the last 24 hours include none.  Plan  Current plan is for [facility] placement.  Nicholas Ali is not under involuntary commitment.    Winfield Rast, MD 09/17/21 [phone]

## 2021-09-17 NOTE — ED Notes (Signed)
VOL/Pending Placement 

## 2021-09-17 NOTE — ED Notes (Signed)
VOL  PENDING  PLACEMENT 

## 2021-09-17 NOTE — BH Assessment (Signed)
[facility] is under [facility] at [facility]; however they will not be able to accept patient until later this week due to staffing. Writer spoke with Nicholas Ali.

## 2021-09-18 NOTE — ED Notes (Signed)
Patient up to bathroom, He is safe, calm and cooperative.

## 2021-09-18 NOTE — ED Notes (Signed)
Pt given lunch tray.

## 2021-09-18 NOTE — ED Notes (Signed)
Patient to [facility] later this week due to staffing

## 2021-09-18 NOTE — ED Notes (Signed)
EVS offering to clean pt room, pt refusing at this time. 

## 2021-09-18 NOTE — ED Notes (Signed)
Patient ate 100% of breakfast and beverage.  

## 2021-09-18 NOTE — ED Notes (Signed)
Niece Alexia Penick called to check on patient. This Clinical research associate asked verbal permission to speak with niece and give information.  Patient gave verbal consent. Niece states patient lives with her and she has been trying to get patient help. Number and info placed in chart. Niece would like a phone call when patient is discharged.

## 2021-09-18 NOTE — ED Notes (Signed)
Patient ate 100% of supper and beverage.  

## 2021-09-18 NOTE — ED Notes (Signed)
Pt given snack. 

## 2021-09-18 NOTE — ED Notes (Signed)

## 2021-09-18 NOTE — BH Assessment (Signed)
[facility] [phone]) Per staff, pt wouldn't be able to be admitted until the weekend due to staffing issues. Writer spoke with Susie, LPN who explained that the pt can call the facility Friday or Saturday to check on a bed.   [facility] [phone]) unable to reach intake staff after business hours.

## 2021-09-18 NOTE — ED Notes (Signed)
Patient received His supper tray and beverage.

## 2021-09-18 NOTE — ED Notes (Signed)
Breakfast and beverage served.  

## 2021-09-19 NOTE — BH Assessment (Signed)
Writer spoke with the patient to complete an updated/reassessment. Patient denies SI/HI and AV/H.  Patient was provided information for substance abuse & mental health outpatient treatment for [facility] & [facility].

## 2021-09-19 NOTE — ED Provider Notes (Addendum)
Emergency Medicine Observation Re-evaluation Note  Nicholas Ali is a 56 y.o. male, seen on rounds today.  Pt initially presented to the ED for complaints of Depression and Psychiatric Evaluation  No acute events overnight.  Physical Exam  BP 101/78 (BP Location: Right Arm)   Pulse 74   Temp 98.2 F (36.8 C) (Oral)   Resp 18   Ht 6\' 1"  (1.854 m)   Wt 94.8 kg   SpO2 98%   BMI 27.57 kg/m   ED Course / MDM   No recent lab work for review.  Plan  Current plan is for placement to an appropriate living facility once available.Nicholas Ali is not under involuntary commitment.     Harvest Dark, MD 09/19/21 [phone]   Patient has been seen by psychiatry they believe the patient safe for discharge home from psychiatric standpoint.  They will follow-up with [facility].  Medical work-up is been largely nonrevealing.    Harvest Dark, MD 09/19/21 1152

## 2021-09-19 NOTE — ED Notes (Signed)
Hospital meal provided, pt tolerated w/o complaints.  Waste discarded appropriately.  

## 2021-09-19 NOTE — ED Notes (Signed)
Breakfast tray provided with beverage  

## 2021-09-19 NOTE — ED Notes (Signed)
VOL/Pending Placement 

## 2022-01-24 ENCOUNTER — Ambulatory Visit: Payer: Medicaid Other | Admitting: Nurse Practitioner

## 2022-08-13 NOTE — Progress Notes (Deleted)
New patient visit   Patient: Nicholas Ali   DOB: 05-02-1965   57 y.o. Male  MRN: [id number] Visit Date: 08/14/2022  Today's healthcare provider: Debera Lat, PA-C   No chief complaint on file.  Subjective    Nicholas Ali is a 57 y.o. male who presents today as a new patient to establish care.  HPI  ***  Past Medical History:  Diagnosis Date   Alcohol abuse    Epistaxis    No past surgical history on file. No family status information on file.   No family history on file. Social History   Socioeconomic History   Marital status: Single    Spouse name: Not on file   Number of children: Not on file   Years of education: Not on file   Highest education level: Not on file  Occupational History   Not on file  Tobacco Use   Smoking status: Never   Smokeless tobacco: Former  Substance and Sexual Activity   Alcohol use: Yes   Drug use: No   Sexual activity: Not on file  Other Topics Concern   Not on file  Social History Narrative   Not on file   Social Determinants of Health   Financial Resource Strain: Not on file  Food Insecurity: Not on file  Transportation Needs: Not on file  Physical Activity: Not on file  Stress: Not on file  Social Connections: Not on file   Outpatient Medications Prior to Visit  Medication Sig   B Complex-C-Folic Acid (B COMPLEX-VITAMIN C-FOLIC ACID) 1 MG tablet Take 1 tablet by mouth daily with breakfast. (Patient not taking: Reported on 09/07/2021)   thiamine 100 MG tablet Take 1 tablet (100 mg total) by mouth daily. (Patient not taking: Reported on 09/07/2021)   traZODone (DESYREL) 50 MG tablet Take 50 mg by mouth at bedtime as needed. (Patient not taking: Reported on 09/07/2021)   No facility-administered medications prior to visit.   No Known Allergies   There is no immunization history on file for this patient.  Health Maintenance  Topic Date Due   COVID-19 Vaccine (1) Never done   HIV Screening  Never done   Hepatitis C  Screening  Never done   DTaP/Tdap/Td (1 - Tdap) Never done   COLONOSCOPY (Pts 45-65yrs Insurance coverage will need to be confirmed)  Never done   Zoster Vaccines- Shingrix (1 of 2) Never done   INFLUENZA VACCINE  11/13/2022   HPV VACCINES  Aged Out    Patient Care Team: Pcp, No as PCP - General  Review of Systems  Constitutional: Negative.   HENT: Negative.    Eyes: Negative.   Respiratory: Negative.    Cardiovascular: Negative.   Gastrointestinal: Negative.   Endocrine: Negative.   Genitourinary: Negative.   Musculoskeletal: Negative.   Skin: Negative.   Allergic/Immunologic: Negative.   Neurological: Negative.   Hematological: Negative.   Psychiatric/Behavioral: Negative.      {Labs  Heme  Chem  Endocrine  Serology  Results Review (optional):23779}   Objective    There were no vitals taken for this visit. {Show previous vital signs (optional):23777}  Physical Exam ***  Depression Screen     No data to display         No results found for any visits on 08/14/22.  Assessment & Plan     ***  No follow-ups on file.     {provider attestation***:1}   Debera Lat, PA-C  [facility] [phone] (phone) [phone] (fax)  [place]

## 2022-08-14 ENCOUNTER — Ambulatory Visit: Payer: Self-pay | Admitting: Physician Assistant
